# Patient Record
Sex: Female | Born: 1985 | Race: Black or African American | Hispanic: No | Marital: Single | Smoking: Never smoker
Health system: Southern US, Community
[De-identification: ages and names within clinical notes are randomized; demographics above are authoritative.]

## PROBLEM LIST (undated history)

## (undated) DIAGNOSIS — J45909 Unspecified asthma, uncomplicated: Secondary | ICD-10-CM

---

## 2011-03-24 ENCOUNTER — Emergency Department (HOSPITAL_COMMUNITY)
Admission: EM | Admit: 2011-03-24 | Discharge: 2011-03-24 | Disposition: A | Discharge status: Home / Self Care | Payer: Self-pay | Attending: Emergency Medicine | Admitting: Emergency Medicine

## 2011-03-24 ENCOUNTER — Encounter (HOSPITAL_COMMUNITY): Payer: Self-pay | Admitting: *Deleted

## 2011-03-24 DIAGNOSIS — H5710 Ocular pain, unspecified eye: Secondary | ICD-10-CM | POA: Insufficient documentation

## 2011-03-24 DIAGNOSIS — H109 Unspecified conjunctivitis: Secondary | ICD-10-CM | POA: Insufficient documentation

## 2011-03-24 MED ORDER — FLUORESCEIN SODIUM 1 MG OP STRP
1.0000 | ORAL_STRIP | Freq: Once | OPHTHALMIC | Status: AC
  Administered 2011-03-24: 1 via OPHTHALMIC

## 2011-03-24 MED ORDER — POLYMYXIN B-TRIMETHOPRIM 10000-0.1 UNIT/ML-% OP SOLN: 1.0000 [drp] | OPHTHALMIC | Status: AC

## 2011-03-24 MED ORDER — TETRACAINE HCL 0.5 % OP SOLN
2.0000 [drp] | Freq: Once | OPHTHALMIC | Status: AC
  Administered 2011-03-24: 2 [drp] via OPHTHALMIC
  Filled 2011-03-24: qty 2

## 2011-03-24 MED ORDER — FLUORESCEIN SODIUM 1 MG OP STRP
ORAL_STRIP | OPHTHALMIC | Status: AC
  Filled 2011-03-24: qty 1

## 2011-03-24 MED ORDER — FLUORESCEIN SODIUM 1 MG OP STRP
1.0000 | ORAL_STRIP | Freq: Once | OPHTHALMIC | Status: AC
  Administered 2011-03-24: 1 via OPHTHALMIC
  Filled 2011-03-24: qty 1

## 2011-03-24 MED ORDER — IBUPROFEN 600 MG PO TABS: 600.0000 mg | ORAL_TABLET | Freq: Four times a day (QID) | ORAL | Status: AC | PRN

## 2011-03-24 NOTE — ED Notes (Signed)
Reports irritation to left eye since yesterday morning, redness and swelling noted, reports intermittent vision changes and pain to eye, sensitive to light.

## 2011-03-24 NOTE — ED Notes (Signed)
Pt presents to department for evaluation of L eye redness and irritation. Onset Friday morning. Pt also states eye watering and blurred vision. Eye noted to be red and irritated upon arrival to ED. No discharge noted. No headaches. Pupils 3mm, round and reactive. Pt alert and oriented x4. No signs of distress at the present.

## 2011-03-24 NOTE — ED Provider Notes (Signed)
History     CSN: [id number]  Arrival date & time 03/24/11  1625   First MD Initiated Contact with Patient 03/24/11 1646      Chief Complaint  Patient presents with  . Eye Pain    (Consider location/radiation/quality/duration/timing/severity/associated sxs/prior treatment) Patient is a 26 y.o. female presenting with eye pain. The history is provided by the patient.  Eye Pain This is a new problem. The current episode started yesterday. The problem occurs constantly. The problem has been unchanged. Pertinent negatives include no abdominal pain, chills, congestion, coughing, fever, headaches, nausea, swollen glands or vomiting. Exacerbated by: light. She has tried nothing for the symptoms.    History reviewed. No pertinent past medical history.  History reviewed. No pertinent past surgical history.  History reviewed. No pertinent family history.  History  Substance Use Topics  . Smoking status: Not on file  . Smokeless tobacco: Not on file  . Alcohol Use: No    OB History    Grav Para Term Preterm Abortions TAB SAB Ect Mult Living                  Review of Systems  Constitutional: Negative for fever and chills.  HENT: Negative for congestion.   Eyes: Positive for pain.  Respiratory: Negative for cough.   Gastrointestinal: Negative for nausea, vomiting and abdominal pain.  Neurological: Negative for headaches.  All other systems reviewed and are negative.    Allergies  Review of patient's allergies indicates no known allergies.  Home Medications  No current outpatient prescriptions on file.  BP 100/59  Pulse 79  Temp(Src) 98.7 F (37.1 C) (Oral)  Resp 20  SpO2 99%  Physical Exam  Nursing note and vitals reviewed. Constitutional: She is oriented to person, place, and time. She appears well-developed and well-nourished. No distress.  HENT:  Head: Normocephalic and atraumatic.  Mouth/Throat: Oropharynx is clear and moist.  Eyes: EOM and lids are normal.  Pupils are equal, round, and reactive to light. No foreign bodies found. Left eye exhibits no hordeolum. No foreign body present in the left eye. Left conjunctiva is injected.  Slit lamp exam:      The left eye shows no corneal abrasion, no corneal ulcer, no foreign body and no fluorescein uptake.       Left eye injected with erythema and tearing. 20/25 visual acuity in right eye, 20/30 in left eye. Left eye IOP 17.   Neck: Normal range of motion. Neck supple.  Cardiovascular: Normal rate, regular rhythm and normal heart sounds.  Exam reveals no friction rub.   No murmur heard. Pulmonary/Chest: Effort normal and breath sounds normal. No respiratory distress. She has no wheezes. She has no rales.  Abdominal: Soft. There is no tenderness. There is no rebound and no guarding.  Musculoskeletal: Normal range of motion. She exhibits no edema and no tenderness.  Lymphadenopathy:    She has no cervical adenopathy.  Neurological: She is alert and oriented to person, place, and time.  Skin: Skin is warm and dry. No rash noted.  Psychiatric: She has a normal mood and affect. Her behavior is normal.    ED Course  Procedures (including critical care time)      1. Conjunctivitis       MDM  69:51 PM 26 year old female presenting with left eye pain, redness, irritation and tearing that started yesterday upon awakening. She endorses a foreign body sensation. She endorses intermittent blurry vision but denies any headache, nausea or vomiting. She  denies any history of similar symptoms in the past. She denies any history of recent trauma. Her conjunctivae appear inflamed and injected. There is no visible foreign body. Inversion of her eyelid was attempted but the view was limited due to the patient's involuntary spasm of her eyelid. Her visual acuity was intact. There is no pain with indirect consensual response. Patchy uptake was seen in the lower part of the sclera with fluorescein uptake. Intraocular  pressure 17. History and physical are not consistent with glaucoma. Her pain was improved with tetracaine, and given this along with the absence of worsening pain with indirect consensual response doubt uveitis. This is likely a conjunctivitis. Will discharge with Motrin, Polytrim drops and ophthalmology followup. This was discussed with the patient and she is understanding. The patient was given strong return precautions and discharged home in stable condition.        Sheran Luz, MD 03/24/11 2135  Sheran Luz, MD 03/24/11 2136

## 2011-03-25 NOTE — ED Provider Notes (Signed)
I saw and evaluated the patient, reviewed the resident's note and I agree with the findings and plan.   Michaela Shankel M Sheretta Grumbine, MD 03/25/11 0040 

## 2012-03-17 ENCOUNTER — Encounter (HOSPITAL_COMMUNITY): Payer: Self-pay | Admitting: Emergency Medicine

## 2012-03-17 ENCOUNTER — Emergency Department (HOSPITAL_COMMUNITY)
Admission: EM | Admit: 2012-03-17 | Discharge: 2012-03-17 | Disposition: A | Discharge status: Home / Self Care | Payer: Self-pay | Attending: Emergency Medicine | Admitting: Emergency Medicine

## 2012-03-17 DIAGNOSIS — M25519 Pain in unspecified shoulder: Secondary | ICD-10-CM | POA: Insufficient documentation

## 2012-03-17 DIAGNOSIS — R071 Chest pain on breathing: Secondary | ICD-10-CM | POA: Insufficient documentation

## 2012-03-17 DIAGNOSIS — R0789 Other chest pain: Secondary | ICD-10-CM

## 2012-03-17 MED ORDER — IBUPROFEN 100 MG/5ML PO SUSP: 600.0000 mg | Freq: Three times a day (TID) | ORAL | Status: DC | PRN

## 2012-03-17 MED ORDER — CYCLOBENZAPRINE HCL 10 MG PO TABS: 10.0000 mg | ORAL_TABLET | Freq: Two times a day (BID) | ORAL | Status: DC | PRN

## 2012-03-17 NOTE — ED Provider Notes (Signed)
Medical screening examination/treatment/procedure(s) were performed by non-physician practitioner and as supervising physician I was immediately available for consultation/collaboration.   Richardean Canal, MD 03/17/12 [phone]

## 2012-03-17 NOTE — ED Notes (Signed)
Rt sided  Shoulder and back pain x 4-5 days  No injury she states hurts to cough

## 2012-03-17 NOTE — ED Notes (Signed)
Patient is alert and orientedx4.  Patient was explained discharge instructions and they understood them with no questions.   

## 2012-03-17 NOTE — ED Provider Notes (Signed)
History     CSN: [id number]  Arrival date & time 03/17/12  1151   First MD Initiated Contact with Patient 03/17/12 1345      No chief complaint on file.   (Consider location/radiation/quality/duration/timing/severity/associated sxs/prior treatment) [name] presenting with shoulder pain. The history is provided by the patient.  Shoulder Pain This is a new problem. Associated symptoms include chest pain. Pertinent negatives include no abdominal pain, chills, coughing, fever, nausea, rash or vomiting. Associated symptoms comments: 4-5 days of pain and soreness in right shoulder and right chest wall without known injury. She reports she has a 45-year old she is constantly picking up but other than that she does not do any heavy lifting. No SOB, cough, fever, nausea, abdominal pain. The discomfort is felt when moving. There is no increased pain with breathing. No back pain.Marland Kitchen    History reviewed. No pertinent past medical history.  History reviewed. No pertinent past surgical history.  No family history on file.  History  Substance Use Topics  . Smoking status: Never Smoker   . Smokeless tobacco: Not on file  . Alcohol Use: Yes    OB History   Grav Para Term Preterm Abortions TAB SAB Ect Mult Living                  Review of Systems  Constitutional: Negative for fever and chills.  Respiratory: Negative.  Negative for cough and shortness of breath.   Cardiovascular: Positive for chest pain.       See HPI.  Gastrointestinal: Negative.  Negative for nausea, vomiting and abdominal pain.  Musculoskeletal:       C/O chest pain.  Skin: Negative.  Negative for rash.    Allergies  Review of patient's allergies indicates no known allergies.  Home Medications  No current outpatient prescriptions on file.  BP 99/62  Pulse 65  Temp(Src) 98.9 F (37.2 C)  Resp 18  SpO2 100%  Physical Exam  Constitutional: She is oriented to person, place, and time. She  appears well-developed and well-nourished. No distress.  Neck: Normal range of motion.  Cardiovascular: Normal rate and regular rhythm.   No murmur heard. Pulmonary/Chest: Effort normal. She has no wheezes. She has no rales.  Mild tenderness to right chest wall laterally. No bruising or swelling. No breast tenderness. Right shoulder without swelling or tenderness.   Abdominal: Soft. She exhibits no mass. There is no tenderness. There is no rebound and no guarding.  No tenderness to light or deep palpation of abdomen. No increased chest pain with palpation of abdomen.  Neurological: She is alert and oriented to person, place, and time.  Skin: Skin is warm and dry.  Psychiatric: She has a normal mood and affect.    ED Course  Procedures (including critical care time)  Labs Reviewed - No data to display No results found.   No diagnosis found.  1. Chest wall pain   MDM  Musculoskeletal chest wall pain. Without fever, SOB, cough or abdominal pain, doubt other acute process.        Arnoldo Hooker, PA-C 03/17/12 1512

## 2012-03-17 NOTE — Discharge Instructions (Signed)
Chest Wall Pain Chest wall pain is pain in or around the bones and muscles of your chest. It may take up to 6 weeks to get better. It may take longer if you must stay physically active in your work and activities.  CAUSES  Chest wall pain may happen on its own. However, it may be caused by:  A viral illness like the flu.  Injury.  Coughing.  Exercise.  Arthritis.  Fibromyalgia.  Shingles. HOME CARE INSTRUCTIONS   Avoid overtiring physical activity. Try not to strain or perform activities that cause pain. This includes any activities using your chest or your abdominal and side muscles, especially if heavy weights are used.  Put ice on the sore area.  Put ice in a plastic bag.  Place a towel between your skin and the bag.  Leave the ice on for 15 to 20 minutes per hour while awake for the first 2 days.  Only take over-the-counter or prescription medicines for pain, discomfort, or fever as directed by your caregiver. SEEK IMMEDIATE MEDICAL CARE IF:   Your pain increases, or you are very uncomfortable.  You have a fever.  Your chest pain becomes worse.  You have new, unexplained symptoms.  You have nausea or vomiting.  You feel sweaty or lightheaded.  You have a cough with phlegm (sputum), or you cough up blood. MAKE SURE YOU:   Understand these instructions.  Will watch your condition.  Will get help right away if you are not doing well or get worse. Document Released: 01/15/2005 Document Revised: 04/09/2011 Document Reviewed: 09/11/2010 [facility] Patient Information 2013 [place], [place]. Heat Therapy Heat therapy can help ease achy, tense, stiff, and tight muscles and joints. Heat should not be used on new injuries. Wait at least 48 hours after the injury before using heat therapy. Heat also should not be used for discomfort or pain that occurs right after doing an activity. If you still have pain or stiffness 3 hours after finishing the activity, then heat  therapy may be used. PRECAUTIONS  High heat or prolonged exposure to heat can cause burns. Be careful when using heat therapy to avoid burning your skin. If you have any of the following conditions, do not use heat until you have discussed heat therapy with your caregiver:  Poor circulation.  Healing wounds or scarred skin in the area being treated.  Diabetes, heart disease, or high blood pressure.  Numbness of the area being treated.  Unusual swelling of the area being treated.  Active infections.  Blood clots.  Cancer.  Inability to communicate your response to pain. This can include young children and people with dementia. HOME CARE INSTRUCTIONS Moist heat pack  Soak a clean towel in warm water, and squeeze out the extra water. The water temperature should be comfortable to the skin.  Put the warm, wet towel in a plastic bag.  Place a thin, dry towel between your skin and the bag.  Put the heat pack on the area for 5 minutes, and check your skin. Your skin may be pink, but it should not be red.  Leave the heat pack on the area for a total of 15 to 30 minutes.  Repeat this every 2 to 4 hours while awake. Do not use heat while you are sleeping. Warm water bath  Fill a tub with warm water. The water temperature should be comfortable to the skin.  Place the affected body part in the tub.  Soak the area for 20  to 40 minutes.  Repeat as needed. Hot water bottle  Fill the water bottle half full with hot water.  Press out the extra air. Close the cap tightly.  Place a dry towel between your skin and the bottle.  Put the bottle on the area for 5 minutes, and check your skin. Your skin may be pink, but it should not be red.  Leave the bottle on the area for a total of 15 to 30 minutes.  Repeat this every 2 to 4 hours while awake. Electric heating pad  Place a dry towel between your skin and the heating pad.  Set the heating pad on low heat.  Put the heating pad  on the area for 10 minutes, and check your skin. Your skin may be pink, but it should not be red.  Leave the heating pad on the area for a total of 20 to 40 minutes.  Repeat this every 2 to 4 hours while awake.  Do not lie on the heating pad.  Do not fall asleep while using the heating pad.  Do not use the heating pad near water. Contact with water can result in an electrical shock. SEEK MEDICAL CARE IF:  You have blisters, redness, swelling, or numbness.  You have any new problems.  Your problems are getting worse.  You have any questions or concerns. If you develop any problems, stop using heat therapy until you see your caregiver. MAKE SURE YOU:  Understand these instructions.  Will watch your condition.  Will get help right away if you are not doing well or get worse. Document Released: 04/09/2011 Document Reviewed: 04/09/2011 [facility] Patient Information 2013 [place], [place].

## 2012-06-10 ENCOUNTER — Encounter (HOSPITAL_COMMUNITY): Payer: Self-pay | Admitting: Emergency Medicine

## 2012-06-10 ENCOUNTER — Emergency Department (HOSPITAL_COMMUNITY)
Admission: EM | Admit: 2012-06-10 | Discharge: 2012-06-10 | Disposition: A | Discharge status: Home / Self Care | Payer: Self-pay | Attending: Emergency Medicine | Admitting: Emergency Medicine

## 2012-06-10 DIAGNOSIS — J029 Acute pharyngitis, unspecified: Secondary | ICD-10-CM | POA: Insufficient documentation

## 2012-06-10 DIAGNOSIS — R05 Cough: Secondary | ICD-10-CM | POA: Insufficient documentation

## 2012-06-10 DIAGNOSIS — R059 Cough, unspecified: Secondary | ICD-10-CM | POA: Insufficient documentation

## 2012-06-10 DIAGNOSIS — R062 Wheezing: Secondary | ICD-10-CM | POA: Insufficient documentation

## 2012-06-10 MED ORDER — BENZONATATE 100 MG PO CAPS: 100.0000 mg | ORAL_CAPSULE | Freq: Three times a day (TID) | ORAL | Status: DC

## 2012-06-10 MED ORDER — AEROCHAMBER PLUS FLO-VU MEDIUM MISC: 1.0000 | Freq: Once | Status: DC

## 2012-06-10 MED ORDER — AEROCHAMBER Z-STAT PLUS/MEDIUM MISC
1.0000 | Freq: Once | Status: AC
  Administered 2012-06-10: 1

## 2012-06-10 MED ORDER — ALBUTEROL SULFATE HFA 108 (90 BASE) MCG/ACT IN AERS
2.0000 | INHALATION_SPRAY | RESPIRATORY_TRACT | Status: DC | PRN
  Administered 2012-06-10: 2 via RESPIRATORY_TRACT
  Filled 2012-06-10: qty 6.7

## 2012-06-10 NOTE — ED Notes (Signed)
Cough x 3 weeks getting worse has sorethroat now

## 2012-06-10 NOTE — ED Provider Notes (Signed)
Medical screening examination/treatment/procedure(s) were performed by non-physician practitioner and as supervising physician I was immediately available for consultation/collaboration.  Demontez Novack L Domenik Trice, MD 06/10/12 1628 

## 2012-06-10 NOTE — ED Provider Notes (Signed)
History     CSN: [id number]  Arrival date & time 06/10/12  1230   First MD Initiated Contact with Patient 06/10/12 1249      Chief Complaint  Patient presents with  . Cough    (Consider location/radiation/quality/duration/timing/severity/associated sxs/prior treatment) HPI Comments: Patient presents with nonproductive cough for the past 3 weeks. At the onset she had viral upper respiratory tract infection symptoms. Symptoms resolved except for cough. No treatments prior to arrival. No shortness of breath. Patient is not a smoker. Onset of symptoms gradual. Course is persistent. Nothing makes symptoms better or worse. Patient noted a mild sore throat today.   [name] presenting with cough. The history is provided by the patient.  Cough Associated symptoms: sore throat   Associated symptoms: no chest pain, no fever, no headaches, no myalgias, no rash, no rhinorrhea and no shortness of breath     History reviewed. No pertinent past medical history.  No past surgical history on file.  No family history on file.  History  Substance Use Topics  . Smoking status: Never Smoker   . Smokeless tobacco: Not on file  . Alcohol Use: Yes    OB History   Grav Para Term Preterm Abortions TAB SAB Ect Mult Living                  Review of Systems  Constitutional: Negative for fever.  HENT: Positive for sore throat. Negative for rhinorrhea.   Eyes: Negative for redness.  Respiratory: Positive for cough. Negative for shortness of breath.   Cardiovascular: Negative for chest pain.  Gastrointestinal: Negative for nausea, vomiting, abdominal pain and diarrhea.  Genitourinary: Negative for dysuria.  Musculoskeletal: Negative for myalgias.  Skin: Negative for rash.  Neurological: Negative for headaches.    Allergies  Review of patient's allergies indicates no known allergies.  Home Medications   Current Outpatient Rx  Name  Route  Sig  Dispense  Refill  .  benzonatate (TESSALON) 100 MG capsule   Oral   Take 1 capsule (100 mg total) by mouth every 8 (eight) hours.   15 capsule   0     BP 104/69  Pulse 81  Temp(Src) 99.4 F (37.4 C)  Resp 16  SpO2 99%  Physical Exam  Nursing note and vitals reviewed. Constitutional: She appears well-developed and well-nourished.  HENT:  Head: Normocephalic and atraumatic. No trismus in the jaw.  Right Ear: Tympanic membrane, external ear and ear canal normal.  Left Ear: Tympanic membrane, external ear and ear canal normal.  Nose: Nose normal. No mucosal edema or rhinorrhea.  Mouth/Throat: Uvula is midline, oropharynx is clear and moist and mucous membranes are normal. Mucous membranes are not dry. No oral lesions. No edematous. No oropharyngeal exudate, posterior oropharyngeal edema, posterior oropharyngeal erythema or tonsillar abscesses.  Eyes: Conjunctivae are normal. Right eye exhibits no discharge. Left eye exhibits no discharge.  Neck: Normal range of motion. Neck supple.  Cardiovascular: Normal rate, regular rhythm and normal heart sounds.   No murmur heard. Pulmonary/Chest: Effort normal. No respiratory distress. She has wheezes (Mild, expiratory, bases). She has no rales.  Abdominal: Soft. There is no tenderness.  Lymphadenopathy:    She has no cervical adenopathy.  Neurological: She is alert.  Skin: Skin is warm and dry.  Psychiatric: She has a normal mood and affect.    ED Course  Procedures (including critical care time)  Labs Reviewed - No data to display No results found.  1. Post-viral cough syndrome    1:17 PM Patient seen and examined.   Vital signs reviewed and are as follows: Filed Vitals:   06/10/12 1239  BP: 104/69  Pulse: 81  Temp: 99.4 F (37.4 C)  Resp: 16   Patient counseled on use of albuterol HFA.  Told to use 1-2 puffs q 4 hours as needed for SOB.  Patient urged to return with worsening symptoms or other concerns. Patient verbalized understanding and  agrees with plan.    MDM  Patient with continued cough after a viral syndrome. Will treat conservatively with albuterol inhaler and Tessalon. Patient appears well. No respiratory distress.        Renne Crigler, PA-C 06/10/12 1321

## 2012-09-21 ENCOUNTER — Emergency Department (HOSPITAL_COMMUNITY)
Admission: EM | Admit: 2012-09-21 | Discharge: 2012-09-21 | Disposition: A | Discharge status: Home / Self Care | Payer: Self-pay | Attending: Emergency Medicine | Admitting: Emergency Medicine

## 2012-09-21 ENCOUNTER — Emergency Department (HOSPITAL_COMMUNITY): Payer: Self-pay

## 2012-09-21 DIAGNOSIS — S93402A Sprain of unspecified ligament of left ankle, initial encounter: Secondary | ICD-10-CM

## 2012-09-21 DIAGNOSIS — Y939 Activity, unspecified: Secondary | ICD-10-CM | POA: Insufficient documentation

## 2012-09-21 DIAGNOSIS — Y929 Unspecified place or not applicable: Secondary | ICD-10-CM | POA: Insufficient documentation

## 2012-09-21 DIAGNOSIS — W010XXA Fall on same level from slipping, tripping and stumbling without subsequent striking against object, initial encounter: Secondary | ICD-10-CM | POA: Insufficient documentation

## 2012-09-21 DIAGNOSIS — S93409A Sprain of unspecified ligament of unspecified ankle, initial encounter: Secondary | ICD-10-CM | POA: Insufficient documentation

## 2012-09-21 MED ORDER — IBUPROFEN 100 MG/5ML PO SUSP
600.0000 mg | Freq: Once | ORAL | Status: AC
  Administered 2012-09-21: 600 mg via ORAL
  Filled 2012-09-21: qty 30

## 2012-09-21 NOTE — ED Notes (Signed)
Pt states that she fell and hurt left ankle around 1900

## 2012-09-21 NOTE — ED Provider Notes (Signed)
CSN: [id number]     Arrival date & time 09/21/12  0117 History     None    Chief Complaint  Patient presents with  . Ankle Pain   (Consider location/radiation/quality/duration/timing/severity/associated sxs/prior Treatment) HPI Comments: Patient states she was "fallen around" slipping and injuring her left ankle about 7:00 tonight.  She tried putting ice on it for short period of time, which did not help.  She has a moderate amount of swelling to the lateral malleolus, and painful ambulation.  She did not take the medication or anti-inflammatory for pain control.  She has no history of prior injury to this ankle  [name] presenting with ankle pain. The history is provided by the patient.  Ankle Pain Location:  Ankle Time since incident:  6 hours Injury: yes   Ankle location:  L ankle Pain details:    Quality:  Aching   Radiates to:  Does not radiate   Severity:  Mild   Onset quality:  Sudden   Timing:  Constant Chronicity:  New Dislocation: no   Foreign body present:  No foreign bodies Prior injury to area:  No Relieved by:  None tried Worsened by:  Activity Ineffective treatments:  Ice Associated symptoms: swelling     No past medical history on file. No past surgical history on file. No family history on file. History  Substance Use Topics  . Smoking status: Never Smoker   . Smokeless tobacco: Not on file  . Alcohol Use: Yes   OB History   Grav Para Term Preterm Abortions TAB SAB Ect Mult Living                 Review of Systems  Constitutional: Positive for activity change.  Musculoskeletal: Positive for joint swelling.    Allergies  Review of patient's allergies indicates no known allergies.  Home Medications  No current outpatient prescriptions on file. BP 108/58  Pulse 68  Temp(Src) 98.3 F (36.8 C) (Oral)  Resp 18  SpO2 100% Physical Exam  Nursing note and vitals reviewed. Constitutional: She appears well-developed and  well-nourished.  HENT:  Head: Normocephalic.  Eyes: Pupils are equal, round, and reactive to light.  Neck: Normal range of motion.  Cardiovascular: Normal rate and regular rhythm.   Musculoskeletal: She exhibits edema and tenderness.       Feet:  Neurological: She is alert.    ED Course   Procedures (including critical care time)  Labs Reviewed - No data to display Dg Ankle Complete Left  09/21/2012   *RADIOLOGY REPORT*  Clinical Data: Trauma.  Fall.  Patient twisted left ankle.  Pain and swelling.  LEFT ANKLE COMPLETE - 3+ VIEW  Comparison: None.  Findings: There is lateral greater than medial swelling about the left ankle.  The ankle mortis and talar dome appear intact.  No evidence of acute fracture or subluxation.  No focal bone lesion or bone destruction.  Bone cortex and trabecular architecture appear intact.  No radiopaque soft tissue foreign bodies.  IMPRESSION: Soft tissue swelling about the left ankle.  No acute bony abnormalities are demonstrated.   Original Report Authenticated By: Burman Nieves, M.D.   1. Left ankle sprain, initial encounter     MDM     Arman Filter, NP 09/21/12 2134  Medical screening examination/treatment/procedure(s) were conducted as a shared visit with non-physician practitioner(s) or resident and myself. I personally evaluated the patient during the encounter and agree with the findings and plan  unless otherwise indicated.  Slipped and twisted ankle 7 pm tonight. Worse with movement. Xray reviewed, no acute fx.  Motrin in ED. Fup discussed. Pt does not want crutches. DC Ankle sprain  Enid Skeens, MD 09/22/12 [phone]

## 2012-09-21 NOTE — ED Notes (Signed)
Patient transported to X-ray 

## 2012-12-31 ENCOUNTER — Emergency Department (HOSPITAL_COMMUNITY)
Admission: EM | Admit: 2012-12-31 | Discharge: 2012-12-31 | Disposition: A | Discharge status: Home / Self Care | Payer: Self-pay | Attending: Emergency Medicine | Admitting: Emergency Medicine

## 2012-12-31 ENCOUNTER — Encounter (HOSPITAL_COMMUNITY): Payer: Self-pay | Admitting: Emergency Medicine

## 2012-12-31 DIAGNOSIS — R1110 Vomiting, unspecified: Secondary | ICD-10-CM

## 2012-12-31 DIAGNOSIS — J069 Acute upper respiratory infection, unspecified: Secondary | ICD-10-CM

## 2012-12-31 DIAGNOSIS — J45909 Unspecified asthma, uncomplicated: Secondary | ICD-10-CM | POA: Insufficient documentation

## 2012-12-31 HISTORY — DX: Unspecified asthma, uncomplicated: J45.909

## 2012-12-31 MED ORDER — ALBUTEROL SULFATE HFA 108 (90 BASE) MCG/ACT IN AERS
2.0000 | INHALATION_SPRAY | Freq: Once | RESPIRATORY_TRACT | Status: AC
  Administered 2012-12-31: 2 via RESPIRATORY_TRACT
  Filled 2012-12-31: qty 6.7

## 2012-12-31 NOTE — ED Provider Notes (Signed)
CSN: [id number]     Arrival date & time 12/31/12  1006 History   First MD Initiated Contact with Patient 12/31/12 1117     Chief Complaint  Patient presents with  . Nasal Congestion  . Emesis   (Consider location/radiation/quality/duration/timing/severity/associated sxs/prior Treatment) [name] presenting with vomiting. The history is provided by the patient. No language interpreter was used.  Emesis Severity:  Mild Duration:  1 day Recent urination:  Normal Context: not post-tussive and not self-induced   Relieved by:  None tried Associated symptoms: URI   Associated symptoms: no abdominal pain, no arthralgias, no chills, no diarrhea, no fever, no headaches and no myalgias   Risk factors: no suspect food intake and no travel to endemic areas   Pt is a 27 year old female who presents with URI symptoms for the last 2-3 days and reports 2 episodes of vomiting since last night. She reports that she has had some nasal congestion and an irritated, non-productive cough for the last couple of days. She denies fever, chills, nausea, diarrhea, stiff neck or neck pain. She reports a mild sore throat but no difficulty breathing or shortness of breath. She awoke during the night and had 2 episode of vomiting and has not eaten anything today. She is voiding in her regular pattern without any dysuria or other urinary symptoms. She denies abdominal pain or chest pain. No recent known sick exposure or recent travel.    Past Medical History  Diagnosis Date  . Asthma    No past surgical history on file. No family history on file. History  Substance Use Topics  . Smoking status: Never Smoker   . Smokeless tobacco: Not on file  . Alcohol Use: Yes   OB History   Grav Para Term Preterm Abortions TAB SAB Ect Mult Living                 Review of Systems  Constitutional: Negative for chills and fatigue.  Respiratory: Positive for cough. Negative for chest tightness, shortness of  breath, wheezing and stridor.   Cardiovascular: Negative for chest pain.  Gastrointestinal: Positive for vomiting. Negative for nausea, abdominal pain and diarrhea.  Genitourinary: Negative for dysuria.  Musculoskeletal: Negative for arthralgias, back pain, myalgias, neck pain and neck stiffness.  Neurological: Negative for headaches.  All other systems reviewed and are negative.    Allergies  Review of patient's allergies indicates no known allergies.  Home Medications  No current outpatient prescriptions on file. BP 99/61  Pulse 72  Temp(Src) 98.3 F (36.8 C) (Oral)  Resp 18  Wt 158 lb (71.668 kg)  SpO2 100% Physical Exam  Nursing note and vitals reviewed. Constitutional: She is oriented to person, place, and time. She appears well-developed and well-nourished. No distress.  HENT:  Head: Normocephalic and atraumatic.  Mouth/Throat: Uvula is midline. Posterior oropharyngeal erythema present.  Tonsils grade II. No difficulty swallowing.  Eyes: Conjunctivae and EOM are normal. Pupils are equal, round, and reactive to light.  Neck: Normal range of motion. Neck supple. No JVD present. No tracheal deviation present. No thyromegaly present.  Cardiovascular: Normal rate, regular rhythm, normal heart sounds and intact distal pulses.   Pulmonary/Chest: Effort normal and breath sounds normal. No stridor.  Abdominal: Soft. Bowel sounds are normal. She exhibits no distension. There is no tenderness.  Musculoskeletal: Normal range of motion.  Lymphadenopathy:    She has no cervical adenopathy.  Neurological: She is alert and oriented to person, place,  and time.  Skin: Skin is warm and dry.  Psychiatric: She has a normal mood and affect. Her behavior is normal. Judgment and thought content normal.    ED Course  Procedures (including critical care time) Labs Review Labs Reviewed - No data to display Imaging Review No results found.  EKG Interpretation   None       MDM   1.  URI (upper respiratory infection)   2. Emesis    Afebrile. Non-toxic appearing. URI symptoms for 2 days and a couple episodes of vomiting. In no acute distress. No shortness of breath or difficulty breathing, no chest pain. No abdominal pain or nausea. Tolerating PO fluids here without difficulty. Home with instructions for management of URI symptoms; humidifier, corticosteroid nasal spray and prescription for albuterol inhaler to use as needed if chest tightness or irritation at night. Discussed plan with pt and agrees to return if symptoms worsen. Pt is stable for discharge.      Irish Elders, NP 12/31/12 1249

## 2012-12-31 NOTE — ED Provider Notes (Signed)
Medical screening examination/treatment/procedure(s) were performed by non-physician practitioner and as supervising physician I was immediately available for consultation/collaboration.     Geoffery Lyons, MD 12/31/12 [phone]

## 2012-12-31 NOTE — ED Notes (Signed)
Congestion and vomiting since yesterday

## 2013-03-16 ENCOUNTER — Emergency Department (HOSPITAL_COMMUNITY)
Admission: EM | Admit: 2013-03-16 | Discharge: 2013-03-16 | Disposition: A | Discharge status: Home / Self Care | Payer: Self-pay | Attending: Emergency Medicine | Admitting: Emergency Medicine

## 2013-03-16 ENCOUNTER — Encounter (HOSPITAL_COMMUNITY): Payer: Self-pay | Admitting: Emergency Medicine

## 2013-03-16 DIAGNOSIS — R109 Unspecified abdominal pain: Secondary | ICD-10-CM | POA: Insufficient documentation

## 2013-03-16 DIAGNOSIS — Z79899 Other long term (current) drug therapy: Secondary | ICD-10-CM | POA: Insufficient documentation

## 2013-03-16 DIAGNOSIS — R42 Dizziness and giddiness: Secondary | ICD-10-CM | POA: Insufficient documentation

## 2013-03-16 DIAGNOSIS — Z3202 Encounter for pregnancy test, result negative: Secondary | ICD-10-CM | POA: Insufficient documentation

## 2013-03-16 DIAGNOSIS — J45909 Unspecified asthma, uncomplicated: Secondary | ICD-10-CM | POA: Insufficient documentation

## 2013-03-16 LAB — URINALYSIS, ROUTINE W REFLEX MICROSCOPIC
Bilirubin Urine: NEGATIVE
Glucose, UA: NEGATIVE mg/dL
Hgb urine dipstick: NEGATIVE
Ketones, ur: NEGATIVE mg/dL
Nitrite: NEGATIVE
Protein, ur: NEGATIVE mg/dL
Specific Gravity, Urine: 1.031 — ABNORMAL HIGH (ref 1.005–1.030)
Urobilinogen, UA: 1 mg/dL (ref 0.0–1.0)
pH: 5.5 (ref 5.0–8.0)

## 2013-03-16 LAB — COMPREHENSIVE METABOLIC PANEL
Alkaline Phosphatase: 56 U/L (ref 39–117)
BUN: 10 mg/dL (ref 6–23)
GFR calc Af Amer: >90 mL/min (ref >90)
GFR calc non Af Amer: >90 mL/min (ref >90)
Glucose, Bld: 101 mg/dL — ABNORMAL HIGH (ref 70–99)
Potassium: 4.4 mEq/L (ref 3.7–5.3)
Total Protein: 8.2 g/dL (ref 6.0–8.3)

## 2013-03-16 LAB — CBC WITH DIFFERENTIAL/PLATELET
Basophils Absolute: 0 K/uL (ref 0.0–0.1)
Basophils Relative: 0 % (ref 0–1)
Eosinophils Absolute: 0.1 10*3/uL (ref 0.0–0.7)
Eosinophils Relative: 1 % (ref 0–5)
HCT: 42.9 % (ref 36.0–46.0)
Hemoglobin: 14.4 g/dL (ref 12.0–15.0)
Lymphocytes Relative: 9 % — ABNORMAL LOW (ref 12–46)
Lymphs Abs: 0.6 10*3/uL — ABNORMAL LOW (ref 0.7–4.0)
MCH: 30.7 pg (ref 26.0–34.0)
MCHC: 33.6 g/dL (ref 30.0–36.0)
MCV: 91.5 fL (ref 78.0–100.0)
Monocytes Absolute: 0.7 K/uL (ref 0.1–1.0)
Monocytes Relative: 10 % (ref 3–12)
Neutro Abs: 5.6 K/uL (ref 1.7–7.7)
Neutrophils Relative %: 80 % — ABNORMAL HIGH (ref 43–77)
Platelets: 193 K/uL (ref 150–400)
RBC: 4.69 MIL/uL (ref 3.87–5.11)
RDW: 12.8 % (ref 11.5–15.5)
WBC: 7 K/uL (ref 4.0–10.5)

## 2013-03-16 LAB — COMPREHENSIVE METABOLIC PANEL WITH GFR
ALT: 11 U/L (ref 0–35)
AST: 17 U/L (ref 0–37)
Albumin: 4.2 g/dL (ref 3.5–5.2)
CO2: 25 meq/L (ref 19–32)
Calcium: 9.4 mg/dL (ref 8.4–10.5)
Chloride: 102 meq/L (ref 96–112)
Creatinine, Ser: 0.79 mg/dL (ref 0.50–1.10)
Sodium: 139 meq/L (ref 137–147)
Total Bilirubin: 0.9 mg/dL (ref 0.3–1.2)

## 2013-03-16 LAB — URINE MICROSCOPIC-ADD ON

## 2013-03-16 LAB — POCT PREGNANCY, URINE: Preg Test, Ur: NEGATIVE

## 2013-03-16 NOTE — ED Notes (Signed)
Pt reports lower abdominal pain that started last night, headache, weakness, and some bump on her hands.  LMP - January.  No vaginal discharge or bleeding, no urinary symptoms.  No constipation or diarrhea

## 2013-03-16 NOTE — Discharge Instructions (Signed)
Read the information below.  You may return to the Emergency Department at any time for worsening condition or any new symptoms that concern you.  If you develop high fevers, worsening abdominal pain, uncontrolled vomiting, or are unable to tolerate fluids by mouth, return to the ER for a recheck.     Abdominal Pain, Adult Many things can cause abdominal pain. Usually, abdominal pain is not caused by a disease and will improve without treatment. It can often be observed and treated at home. Your health care provider will do a physical exam and possibly order blood tests and X-rays to help determine the seriousness of your pain. However, in many cases, more time must pass before a clear cause of the pain can be found. Before that point, your health care provider may not know if you need more testing or further treatment. HOME CARE INSTRUCTIONS  Monitor your abdominal pain for any changes. The following actions may help to alleviate any discomfort you are experiencing:  Only take over-the-counter or prescription medicines as directed by your health care provider.  Do not take laxatives unless directed to do so by your health care provider.  Try a clear liquid diet (broth, tea, or water) as directed by your health care provider. Slowly move to a bland diet as tolerated. SEEK MEDICAL CARE IF:  You have unexplained abdominal pain.  You have abdominal pain associated with nausea or diarrhea.  You have pain when you urinate or have a bowel movement.  You experience abdominal pain that wakes you in the night.  You have abdominal pain that is worsened or improved by eating food.  You have abdominal pain that is worsened with eating fatty foods. SEEK IMMEDIATE MEDICAL CARE IF:   Your pain does not go away within 2 hours.  You have a fever.  You keep throwing up (vomiting).  Your pain is felt only in portions of the abdomen, such as the right side or the left lower portion of the  abdomen.  You pass bloody or black tarry stools. MAKE SURE YOU:  Understand these instructions.   Will watch your condition.   Will get help right away if you are not doing well or get worse.  Document Released: 10/25/2004 Document Revised: 11/05/2012 Document Reviewed: 09/24/2012 [facility] Patient Information 2014 [employer] [place].   Emergency Department Resource Guide 1) Find a Doctor and Pay Out of Pocket Although you won't have to find out who is covered by your insurance plan, it is a good idea to ask around and get recommendations. You will then need to call the office and see if the doctor you have chosen will accept you as a new patient and what types of options they offer for patients who are self-pay. Some doctors offer discounts or will set up payment plans for their patients who do not have insurance, but you will need to ask so you aren't surprised when you get to your appointment.  2) Contact Your Local Health Department Not all health departments have doctors that can see patients for sick visits, but many do, so it is worth a call to see if yours does. If you don't know where your local health department is, you can check in your phone book. The CDC also has a tool to help you locate your state's health department, and many state websites also have listings of all of their local health departments.  3) Find a Walk-in Clinic If your illness is not likely to be very  severe or complicated, you may want to try a walk in clinic. These are popping up all over the country in pharmacies, drugstores, and shopping centers. They're usually staffed by nurse practitioners or physician assistants that have been trained to treat common illnesses and complaints. They're usually fairly quick and inexpensive. However, if you have serious medical issues or chronic medical problems, these are probably not your best option.  No Primary Care Doctor: - Call Health Connect at  [phone] - they can  help you locate a primary care doctor that  accepts your insurance, provides certain services, etc. - Physician Referral Service- [phone]  Chronic Pain Problems: Organization         Address  Phone   Notes  [facility]  [phone] Patients need to be referred by their primary care doctor.   Medication Assistance: Organization         Address  Phone   Notes  [facility] Medication [facility] [address] [place] [place], [place] [address] [phone] --Must be a resident of [facility] -- Must have NO insurance coverage whatsoever (no Medicaid/ Medicare, etc.) -- The pt. MUST have a primary care doctor that directs their care regularly and follows them in the community   [facility]  [phone]   [employer]  [phone]    Agencies that provide inexpensive medical care: Organization         Address  Phone   Notes  [facility]  [phone]   [facility]    [phone]   [facility] [address] [place], [place] [address] [phone]   [facility] [place] [address], [place] [phone]   Planned Parenthood    [phone]   [facility]    [phone]   [facility] and [facility]  [address], [place] Phone:  [phone], Fax:  [phone] Hours of Operation:  9 am - 6 pm, M-F.  Also accepts Medicaid/Medicare and self-pay.  [facility]  [address], [place] Phone: [phone], Fax: [phone]. Hours of Operation:  8:30 am - 5:30 pm, M-F.  Also accepts Medicaid and self-pay.  [facility] [place] [address] [place] Phone: [phone]   [facility] [address] Natasha Bence [place], [place] [phone], Ext. [phone] Mondays & Thursdays: 7-9 AM.  First 15 patients are seen on a first come, first serve basis.    Medicaid-accepting [facility] Providers:  Organization         Address  Phone   Notes  [facility] [address], [place] [phone] Also accepts self-pay patients.  [facility] [address] Laurell Josephs [place], [place]  [phone]   [facility] [address], [place] [phone]   [facility] [address], [place] [phone]   Renaye Rakers [address], [place]   [phone] Only accepts [place] Access [place] patients after they have their name applied to their card.   Self-Pay (no insurance) in [facility]:  Organization         Address  Phone   Notes  Sickle Cell Patients, [facility] Internal Medicine [address] [place], [place] [phone]   [facility] [phone]  [address] [place] [phone]   [facility] [place]  [address], [place] [phone]   [facility]/Dr. Osei-Bonsu  [address], [place] or [address], [place] [phone] Phone number for both [employer] and [place] locations is the same.  Urgent Medical and [facility] [address], [place] [phone]   [facility] [address] [place] or [address] [phone]   [facility] [address] [place] [phone], phone; [phone], fax Sees patients 1st and 3rd Saturday of every month.  Must not qualify for public or private insurance (i.e. Medicaid, Medicare, [place] Health Choice, Veterans' Benefits)  Household income should be no more than 200% of the poverty level The clinic cannot treat you if you are pregnant or think you are pregnant  Sexually transmitted diseases are not treated at the clinic.    Dental Care: Organization         Address  Phone  Notes  [facility] Department of [facility]  [facility] [address] [place], [place] [phone] Accepts children up to age 26 who are enrolled in [place] or [place] Health Choice; pregnant women with a Medicaid card; and children who have applied for Medicaid or [place] Health Choice, but were declined, whose parents can pay a reduced fee at time of service.  [facility] of [facility]  [address], [place] [phone] Accepts children up to age 82 who are enrolled in [place] or [place] Health Choice; pregnant women with a Medicaid card; and children who have applied for Medicaid or [place] Health Choice, but were declined, whose parents can pay a reduced fee at time of service.  [facility]  [address] [place], [place] [phone] Patients are seen by appointment only. Walk-ins are not accepted. [facility] will see patients 45 years of age and older. Monday - Tuesday (8am-5pm) Most Wednesdays (8:30-5pm) $30 per visit, cash only  [facility] Adult Dental Access PROGRAM  [address], [facility] [phone] Patients are seen by appointment only. Walk-ins are not accepted. [facility] will see patients 46 years of age and older. One Wednesday Evening (Monthly: Volunteer Based).  $30 per visit, cash only  [employer]  [phone] for adults; Children under age 79, call Graduate Pediatric Dentistry at [phone]. Children aged 84-14, please call [phone] to request a pediatric application.  Dental services are provided in all areas of dental care including fillings, crowns and bridges, complete and partial dentures, implants, gum treatment, root canals, and extractions. Preventive care is also provided. Treatment is provided to both adults and children. Patients are selected via a lottery and there is often a waiting list.   [facility] [address] [place]  [phone] [email or web address]   [facility] [address] [place], [place] [phone], Ext. [phone] Second and Fourth Thursday of each month, opens at 6:30 AM; Clinic ends at 9 AM.  Patients are seen on a first-come first-served basis, and a limited number are seen during each clinic.   [facility]  [address] Ether Griffins [place], [place] [phone]   Eligibility Requirements You must have lived in [place], [place], or [place] counties for at least the last three months.   You  cannot be eligible for state or federal sponsored [place], including [employer], [place], or [employer].   You generally cannot be eligible for healthcare insurance through your employer.    How to apply: Eligibility screenings are held every Tuesday and Wednesday afternoon from 1:00 pm until 4:00 pm. You do not need an appointment for the interview!  [facility] [address] [place], [place] [phone]   [facility] Health Department  [phone]   [facility] Health Department  [phone]   [facility] Health Department  [phone]    Behavioral Health Resources in the Community: Intensive Outpatient Programs Organization         Address  Phone  Notes  [facility] [address] [place], [place] [phone]   [facility] Outpatient [address], [place], [place] [phone]   [facility] [address], [place], [place]  [phone]   [facility] Mental Health [address]  [place], [place] [phone] or [phone]   Substance Abuse Resources Organization         Address  Phone  Notes  [facility] Drug Services  [phone]   [facility]  [phone]   [facility]  [phone]   Floydene Flock  [phone]   Residential & Outpatient Substance Abuse Program  [phone]   Psychological Services Organization         Address  Phone  Notes  [facility] Behavioral Health  [phone]   [facility] Services  [phone]   [facility] Mental Health [address], [place] [phone] or [phone]    Mobile Crisis Teams Organization         Address  Phone  Notes  [facility], Mobile Crisis Care Unit  [phone]   [facility]  [address] [place], [place] [phone]   Doristine Locks [address] [place] [phone]    Self-Help/Support Groups Organization         Address  Phone             Notes  [facility] of [place] - variety of support groups  336- I7437963 Call for more information  Narcotics Anonymous (NA), [facility] [address], [employer] [place]  2 meetings at this location   Statistician         Address  Phone  Notes  [facility] [address],    [place]  [phone]   [facility]  [address], [place] [address], [place], [place] [phone]   [facility] [address] [place], [place] [phone] Admissions: 8am-3pm M-F  [facility] [address],    [place], [place] [phone]   [facility] [address] [place], [place], [place] [phone]   [facility] [address],  [place], [place] [phone]   [facility] - Intensive Outpatient [address]ure[address], [place], [place] [phone]   [facility] (Addiction Recovery [facility] [address] [place]  [place], [place] [phone] or [phone]   [facility] ([facility]) [address], Charels Stambaugh [place], [place] [phone] Accepts Medicaid  [facility] [address]  [place] [phone] Substance Abuse/Addiction Treatment   [facility] Organization         Address  Phone  Notes  [facility]  [phone]   Angie Fava, PhD [address] [place], [place]   [phone] or  [phone]) [phone]   [facility]   [address] [place], [place] [phone]   [facility] [address] [place], [place] [phone] Insurance/Medicaid/sponsorship through [facility] and Families [address]                                    [place], [place] [phone] Therapy/tele-psych/case  [facility] [address]   [place], [place] [phone]    Dr. Lolly Mustache  [phone]   [facility] [place]  [employer] [facility] Dept. 1) [address] [place] 2) [address], [place] 3)  [address], [place] [phone]  [phone]   [facility] Child Abuse Hotline 437 735 5780 or [phone] (After Hours)

## 2013-03-16 NOTE — ED Provider Notes (Signed)
CSN: [id number]     Arrival date & time 03/16/13  1038 History   First MD Initiated Contact with Patient 03/16/13 1247     Chief Complaint  Patient presents with  . Abdominal Pain     (Consider location/radiation/quality/duration/timing/severity/associated sxs/prior Treatment) The history is provided by the patient.   Patient presents with several symptoms that have all currently resolved.  States she developed full body itching x 3-4 days.  Last night developed crampy lower abdominal pain that continued from last night until this morning but is now resolved.  States she was hot and lightheaded while this was going on.  Reports it was not unlike menstrual cramps, which she gets usually before her period. Denies current abdominal pain.  Denies N/V/D, urinary or vaginal symptoms.  LMP Jan 12.  Periods are usually irregular.  Pt states she came in because the last time she felt like this she "had a virus" and she wants to make sure she doesn't have another virus.   No known sick contacts.    Past Medical History  Diagnosis Date  . Asthma    History reviewed. No pertinent past surgical history. No family history on file. History  Substance Use Topics  . Smoking status: Never Smoker   . Smokeless tobacco: Not on file  . Alcohol Use: Yes   OB History   Grav Para Term Preterm Abortions TAB SAB Ect Mult Living                 Review of Systems  Constitutional: Negative for fever.  Respiratory: Negative for cough and shortness of breath.   Cardiovascular: Negative for chest pain.  Gastrointestinal: Positive for abdominal pain (resolved). Negative for nausea, vomiting, diarrhea, constipation and blood in stool.  Genitourinary: Negative for dysuria, urgency, frequency, vaginal bleeding, vaginal discharge and menstrual problem.  Skin: Negative for color change and rash.  All other systems reviewed and are negative.      Allergies  Review of patient's allergies indicates no known  allergies.  Home Medications   Current Outpatient Rx  Name  Route  Sig  Dispense  Refill  . albuterol (PROVENTIL HFA;VENTOLIN HFA) 108 (90 BASE) MCG/ACT inhaler   Inhalation   Inhale 1-2 puffs into the lungs every 6 (six) hours as needed for wheezing or shortness of breath.          BP 106/68  Pulse 88  Temp(Src) 99.8 F (37.7 C) (Oral)  Resp 16  Ht 5\' 2"  (1.575 m)  Wt 163 lb 6.4 oz (74.118 kg)  BMI 29.88 kg/m2  SpO2 99% Physical Exam  Nursing note and vitals reviewed. Constitutional: She appears well-developed and well-nourished. No distress.  HENT:  Head: Normocephalic and atraumatic.  Neck: Neck supple.  Cardiovascular: Normal rate and regular rhythm.   Pulmonary/Chest: Effort normal and breath sounds normal. No respiratory distress. She has no wheezes. She has no rales.  Abdominal: Soft. She exhibits no distension and no mass. There is no tenderness. There is no rebound, no guarding and no CVA tenderness.  Neurological: She is alert.  Skin: She is not diaphoretic.    ED Course  Procedures (including critical care time) Labs Review Labs Reviewed  URINALYSIS, ROUTINE W REFLEX MICROSCOPIC - Abnormal; Notable for the following:    APPearance CLOUDY (*)    Specific Gravity, Urine 1.031 (*)    Leukocytes, UA TRACE (*)    All other components within normal limits  CBC WITH DIFFERENTIAL - Abnormal; Notable for the following:  Neutrophils Relative % 80 (*)    Lymphocytes Relative 9 (*)    Lymphs Abs 0.6 (*)    All other components within normal limits  COMPREHENSIVE METABOLIC PANEL - Abnormal; Notable for the following:    Glucose, Bld 101 (*)    All other components within normal limits  URINE MICROSCOPIC-ADD ON - Abnormal; Notable for the following:    Squamous Epithelial / LPF FEW (*)    Bacteria, UA MANY (*)    All other components within normal limits  URINE CULTURE  POCT PREGNANCY, URINE   Imaging Review No results found.  EKG Interpretation   None       2:33 PM Patient reports she feels better after napping in ED.  Denies lightheadedness with standing.   MDM   Final diagnoses:  Abdominal cramping   Pt c/o abdominal pain that resolved by the time I saw her in the ED.  Exam unremarkable.  Abdominal exam benign.  Labs unremarkable.  UA with many bacteria but only 3-6 WBC and no symptoms except limited lower abdominal cramping.  Will send for culture. Given lower abdominal cramping, I would treat if the culture comes back positive. Discussed result, findings, treatment, and follow up  with patient.  Pt given return precautions.  Pt verbalizes understanding and agrees with plan.        GertyEmily Shiloh Swopes, PA-C 03/16/13 [phone]

## 2013-03-17 LAB — URINE CULTURE

## 2013-03-20 NOTE — ED Provider Notes (Signed)
Medical screening examination/treatment/procedure(s) were performed by non-physician practitioner and as supervising physician I was immediately available for consultation/collaboration.   Payden Bonus Y. Jerusalen Mateja, MD 03/20/13 1459 

## 2013-08-08 ENCOUNTER — Encounter (HOSPITAL_COMMUNITY): Payer: Self-pay | Admitting: Emergency Medicine

## 2013-08-08 ENCOUNTER — Emergency Department (HOSPITAL_COMMUNITY): Payer: Self-pay

## 2013-08-08 ENCOUNTER — Emergency Department (HOSPITAL_COMMUNITY)
Admission: EM | Admit: 2013-08-08 | Discharge: 2013-08-08 | Disposition: A | Discharge status: Home / Self Care | Payer: Self-pay | Attending: Emergency Medicine | Admitting: Emergency Medicine

## 2013-08-08 DIAGNOSIS — S93402A Sprain of unspecified ligament of left ankle, initial encounter: Secondary | ICD-10-CM

## 2013-08-08 DIAGNOSIS — J45909 Unspecified asthma, uncomplicated: Secondary | ICD-10-CM | POA: Insufficient documentation

## 2013-08-08 DIAGNOSIS — X500XXA Overexertion from strenuous movement or load, initial encounter: Secondary | ICD-10-CM | POA: Insufficient documentation

## 2013-08-08 DIAGNOSIS — Y9289 Other specified places as the place of occurrence of the external cause: Secondary | ICD-10-CM | POA: Insufficient documentation

## 2013-08-08 DIAGNOSIS — Z79899 Other long term (current) drug therapy: Secondary | ICD-10-CM | POA: Insufficient documentation

## 2013-08-08 DIAGNOSIS — S93409A Sprain of unspecified ligament of unspecified ankle, initial encounter: Secondary | ICD-10-CM | POA: Insufficient documentation

## 2013-08-08 DIAGNOSIS — R296 Repeated falls: Secondary | ICD-10-CM | POA: Insufficient documentation

## 2013-08-08 DIAGNOSIS — Y9389 Activity, other specified: Secondary | ICD-10-CM | POA: Insufficient documentation

## 2013-08-08 MED ORDER — ACETAMINOPHEN 325 MG PO TABS: 325.0000 mg | ORAL_TABLET | Freq: Four times a day (QID) | ORAL | Status: DC | PRN

## 2013-08-08 NOTE — Discharge Instructions (Signed)
Please call your doctor for a followup appointment within 24-48 hours. When you talk to your doctor please let them know that you were seen in the emergency department and have them acquire all of your records so that they can discuss the findings with you and formulate a treatment plan to fully care for your new and ongoing problems. Please call and set up an appointment with orthopedics to be reassessed regarding foot injury Please rest and stay hydrated Please take medications as prescribed Please keep foot and ankle brace at all times especially when active-please use crutches Please continue to monitor symptoms closely and if symptoms are to worsen or change (fever greater than 101, chills, chest pain, shortness of breath, difficulty breathing, numbness, tingling, worsening or changes to pain pattern, swelling, fall, injury, loss of sensation, red streaks running up the foot) please report back to the ED immediately   Ankle Sprain An ankle sprain is an injury to the strong, fibrous tissues (ligaments) that hold the bones of your ankle joint together.  CAUSES An ankle sprain is usually caused by a fall or by twisting your ankle. Ankle sprains most commonly occur when you step on the outer edge of your foot, and your ankle turns inward. People who participate in sports are more prone to these types of injuries.  SYMPTOMS   Pain in your ankle. The pain may be present at rest or only when you are trying to stand or walk.  Swelling.  Bruising. Bruising may develop immediately or within 1 to 2 days after your injury.  Difficulty standing or walking, particularly when turning corners or changing directions. DIAGNOSIS  Your caregiver will ask you details about your injury and perform a physical exam of your ankle to determine if you have an ankle sprain. During the physical exam, your caregiver will press on and apply pressure to specific areas of your foot and ankle. Your caregiver will try to  move your ankle in certain ways. An X-ray exam may be done to be sure a bone was not broken or a ligament did not separate from one of the bones in your ankle (avulsion fracture).  TREATMENT  Certain types of braces can help stabilize your ankle. Your caregiver can make a recommendation for this. Your caregiver may recommend the use of medicine for pain. If your sprain is severe, your caregiver may refer you to a surgeon who helps to restore function to parts of your skeletal system (orthopedist) or a physical therapist. HOME CARE INSTRUCTIONS   Apply ice to your injury for 1-2 days or as directed by your caregiver. Applying ice helps to reduce inflammation and pain.  Put ice in a plastic bag.  Place a towel between your skin and the bag.  Leave the ice on for 15-20 minutes at a time, every 2 hours while you are awake.  Only take over-the-counter or prescription medicines for pain, discomfort, or fever as directed by your caregiver.  Elevate your injured ankle above the level of your heart as much as possible for 2-3 days.  If your caregiver recommends crutches, use them as instructed. Gradually put weight on the affected ankle. Continue to use crutches or a cane until you can walk without feeling pain in your ankle.  If you have a plaster splint, wear the splint as directed by your caregiver. Do not rest it on anything harder than a pillow for the first 24 hours. Do not put weight on it. Do not get it wet.  You may take it off to take a shower or bath.  You may have been given an elastic bandage to wear around your ankle to provide support. If the elastic bandage is too tight (you have numbness or tingling in your foot or your foot becomes cold and blue), adjust the bandage to make it comfortable.  If you have an air splint, you may blow more air into it or let air out to make it more comfortable. You may take your splint off at night and before taking a shower or bath. Wiggle your toes in the  splint several times per day to decrease swelling. SEEK MEDICAL CARE IF:   You have rapidly increasing bruising or swelling.  Your toes feel extremely cold or you lose feeling in your foot.  Your pain is not relieved with medicine. SEEK IMMEDIATE MEDICAL CARE IF:  Your toes are numb or blue.  You have severe pain that is increasing. MAKE SURE YOU:   Understand these instructions.  Will watch your condition.  Will get help right away if you are not doing well or get worse. Document Released: 01/15/2005 Document Revised: 10/10/2011 Document Reviewed: 01/27/2011 [facility] Patient Information 2015 [place], [place]. This information is not intended to replace advice given to you by your health care provider. Make sure you discuss any questions you have with your health care provider.   Emergency Department Resource Guide 1) Find a Doctor and Pay Out of Pocket Although you won't have to find out who is covered by your insurance plan, it is a good idea to ask around and get recommendations. You will then need to call the office and see if the doctor you have chosen will accept you as a new patient and what types of options they offer for patients who are self-pay. Some doctors offer discounts or will set up payment plans for their patients who do not have insurance, but you will need to ask so you aren't surprised when you get to your appointment.  2) Contact Your Local Health Department Not all health departments have doctors that can see patients for sick visits, but many do, so it is worth a call to see if yours does. If you don't know where your local health department is, you can check in your phone book. The CDC also has a tool to help you locate your state's health department, and many state websites also have listings of all of their local health departments.  3) Find a Walk-in Clinic If your illness is not likely to be very severe or complicated, you may want to try a walk in clinic.  These are popping up all over the country in pharmacies, drugstores, and shopping centers. They're usually staffed by nurse practitioners or physician assistants that have been trained to treat common illnesses and complaints. They're usually fairly quick and inexpensive. However, if you have serious medical issues or chronic medical problems, these are probably not your best option.  No Primary Care Doctor: - Call Health Connect at  [phone] - they can help you locate a primary care doctor that  accepts your insurance, provides certain services, etc. - Physician Referral Service- [phone]  Chronic Pain Problems: Organization         Address  Phone   Notes  [facility]  [phone] Patients need to be referred by their primary care doctor.   Medication Assistance: Retail buyerrganization         Address  Phone  Notes  [facility] Medication [facility] [address] [place] [place], [place] [address] [phone] --Must be a resident of [facility] -- Must have NO insurance coverage whatsoever (no Medicaid/ Medicare, etc.) -- The pt. MUST have a primary care doctor that directs their care regularly and follows them in the community   [facility]  [phone]   [employer]  [phone]    Agencies that provide inexpensive medical care: Organization         Address  Phone   Notes  [facility]  [phone]   [facility]    [phone]   [facility] [address] [facility] [address] [place], [place] [address] [phone]   [facility] [address] [place], [place] [phone]   Planned Parenthood    [phone]   [facility]    [phone]   [facility]  [address], [place] Phone:  [phone], Fax:  [phone] Hours of Operation:  9 am - 6 pm, M-F.  Also accepts Medicaid/Medicare and self-pay.  [facility]  [address], [place] Phone: [phone], Fax: [phone]. Hours of Operation:  8:30 am - 5:30 pm, M-F.  Also accepts Medicaid and self-pay.  [facility] [place] [address] [place] Phone: [phone]   [facility] [address] Natasha Bence [place], [place] [phone], Ext. [phone] Mondays & Thursdays: 7-9 AM.  First 15 patients are seen on a first come, first serve basis.    Medicaid-accepting [facility] Providers:  Organization         Address  Phone   Notes  [facility] [address], [place] [phone] Also accepts self-pay patients.  [facility] [address] Laurell Josephs [place], [place]  [phone]   [facility] [address], [place] [phone]   [facility] [address], [place] [phone]   Renaye Rakers [address], [place]   [phone] Only accepts [place] Access [place] patients after they have their name applied to their card.   Self-Pay (no insurance) in [facility]:  Organization         Address  Phone   Notes  Sickle Cell Patients, [facility] Internal Medicine [address] [place], [place] [phone]   [facility] [address] [place], [place] [phone]   [facility]  [address], [place] [phone]   [facility]/Dr. Osei-Bonsu  [address] [place] or [address], [place] [phone] Phone number for both [place] and [place] locations is the same.  [facility] [address] [place] [phone]   [facility] [address] [place] or [address] [phone]   [facility] [address], [place] [phone], phone; [phone], fax Sees patients 1st  and 3rd Saturday of every month.  Must not qualify for public or private insurance (i.e. Medicaid, Medicare, [place] Health Choice, Veterans' Benefits)  Household income should be no more than 200% of the poverty level The clinic cannot treat you if you are pregnant or think you are pregnant  Sexually transmitted  diseases are not treated at the clinic.    Dental Care: Organization         Address  Phone  Notes  [facility] Department of [facility] [address] [place], [place] [phone] Accepts children up to age 39 who are enrolled in [place] or [place] Health Choice; pregnant women with a Medicaid card; and children who have applied for Medicaid or [place] Health Choice, but were declined, whose parents can pay a reduced fee at time of service.  [facility] Department of [facility]  [address], [place] [phone] Accepts children up to age 58 who are enrolled in [place] or [place] Health Choice; pregnant women with a Medicaid card; and children who have applied for Medicaid or  Health Choice, but were declined, whose parents can pay a reduced fee at time of service.  [facility]  [address] [place], [place] [phone] Patients are seen by appointment only. Walk-ins are not accepted. [facility] will see patients 22 years of age and older. Monday - Tuesday (8am-5pm) Most Wednesdays (8:30-5pm) $30 per visit, cash only  [facility]  [address], [facility] [phone] Patients are seen by appointment only. Walk-ins are not accepted. [facility] will see patients 30 years of age and older. One Wednesday Evening (Monthly: Volunteer Based).  $30 per visit, cash only  [employer]  [phone] for adults; Children under age 8, call Graduate Pediatric Dentistry at [phone]. Children aged 84-14, please call [phone] to request a pediatric  application.  Dental services are provided in all areas of dental care including fillings, crowns and bridges, complete and partial dentures, implants, gum treatment, root canals, and extractions. Preventive care is also provided. Treatment is provided to both adults and children. Patients are selected via a lottery and there is often a waiting list.   [facility] [address], [place]  [phone] [email or web address]   [facility] [address] [place], [place] [phone], Ext. [phone] Second and Fourth Thursday of each month, opens at 6:30 AM; Clinic ends at 9 AM.  Patients are seen on a first-come first-served basis, and a limited number are seen during each clinic.   [facility]  [address] Ether Griffins [place], [place] [phone]   Eligibility Requirements You must have lived in [place], [place], or [place] counties for at least the last three months.   You cannot be eligible for state or federal sponsored [place], including [employer], [place], or [employer].   You generally cannot be eligible for healthcare insurance through your employer.    How to apply: Eligibility screenings are held every Tuesday and Wednesday afternoon from 1:00 pm until 4:00 pm. You do not need an appointment for the interview!  [facility] [address] [place], [place] [phone]   [facility] Health Department  [phone]   [facility] Health Department  [phone]   [facility] Health Department  [phone]    Behavioral Health Resources in the Community: Intensive Outpatient Programs Organization         Address  Phone  Notes  [facility] [address], [place], [place] [phone]   [facility] Outpatient [address] [place], [place] [phone]   [facility] [address] [place], [place]  [phone]  [facility]  [address],  [place], [place] [phone] or [phone]   Substance Abuse Resources Organization         Address  Phone  Notes  [facility]  [phone]   [facility]  [phone]   [facility]  [phone]   Floydene Flock  [phone]   Residential & Outpatient Substance Abuse Program  [phone]   Psychological Services Organization         Address  Phone  Notes  [facility] Behavioral Health  [phone]   [facility]  [phone]   [facility] Mental Health [address], [place] [phone] or [phone]    Mobile Crisis Teams Organization         Address  Phone  Notes  [facility], Mobile Crisis Care Unit  [phone]   [facility]  [address] [place], [place] [phone]   Doristine Locks [address] [place] [phone]    Self-Help/Support Groups Organization         Address  Phone             Notes  Mental Health Assoc. of  - variety of support groups  336- I7437963 Call for more information  Narcotics Anonymous (NA), [facility] [address], [employer] [place]  2 meetings at this location   Statistician         Address  Phone  Notes  [facility] [address],    [place]  [phone]   [facility]  [address] [place] [address], [place], [place] [phone]   [facility] [address] [place], [place] [phone] Admissions: 8am-3pm M-F  [facility] [address]    [place], [place] [phone]   [facility] [address] [place], [place], [place] [phone]   [facility] [address],  [place], [place] [phone]   [facility] - Intensive Outpatient [address] Laurell Josephs [address], [place], [place] [phone]   [facility] (Addiction [facility] [address] [place].,    [place], [place] [phone] or [phone]   [facility] ([facility]) [address], [place], [place] [phone] Accepts Medicaid  [facility] [address]  [place] [phone] Substance Abuse/Addiction Treatment   [facility] Organization         Address  Phone  Notes  [facility]  [phone]   Angie Fava, PhD [address] Ervin Knack [place], [place]   [phone] or [phone]   [facility] Behavioral   [address] [place], [place] [phone]   [facility] [address] [place], [place] [phone] Insurance/Medicaid/sponsorship through [facility] and Families [address]                                    [place], [place] [phone] Therapy/tele-psych/case  [facility] [address], [place] [phone]    Dr. Lolly Mustache  [phone]   [facility]  [employer] [facility] Dept. 1) [address] [place] 2) [address] [place] 3)  [address], [place] [phone]  [phone]   [facility] Child Abuse Hotline 640-441-2732 or [phone] (After  Hours)

## 2013-08-08 NOTE — ED Provider Notes (Signed)
CSN: [id number]     Arrival date & time 08/08/13  1043 History  This chart was scribed for non-physician practitioner, Raymon MuttonMarissa Libi Corso, PA-C,working with Dagmar HaitWilliam Blair Walden, MD, by Karle PlumberJennifer Tensley, ED Scribe.  This patient was seen in room TR05C/TR05C and the patient's care was started at 11:57 AM.  Chief Complaint  Patient presents with  . Ankle Pain   The history is provided by the patient. No language interpreter was used.   HPI Comments:  Doristine DevoidSemaj Kinzler is a 28 y.o. female who presents to the Emergency Department complaining of sharp, throbbing left ankle pain onset last night. She reports radiation of the pain up her leg. She reports associated swelling last night that has now resolved. She states the pain started after twisting and falling in a parking lot. She states she sprained this same ankle last year. She ace wrapped the ankle and elevated it with a pillow. She states the pain worsens with pressure. Pt has not taken anything for the pain. She denies numbness, tingling or weakness of the extremity. She states her LMP began 07/13/13.  Past Medical History  Diagnosis Date  . Asthma    History reviewed. No pertinent past surgical history. History reviewed. No pertinent family history. History  Substance Use Topics  . Smoking status: Never Smoker   . Smokeless tobacco: Not on file  . Alcohol Use: Yes   OB History   Grav Para Term Preterm Abortions TAB SAB Ect Mult Living                 Review of Systems  Musculoskeletal: Positive for arthralgias.  Neurological: Negative for weakness and numbness.    Allergies  Review of patient's allergies indicates no known allergies.  Home Medications   Prior to Admission medications   Medication Sig Start Date End Date Taking? Authorizing Provider  acetaminophen (TYLENOL) 325 MG tablet Take 1 tablet (325 mg total) by mouth every 6 (six) hours as needed. 08/08/13   Sevag Shearn, PA-C  albuterol (PROVENTIL HFA;VENTOLIN HFA) 108 (90  BASE) MCG/ACT inhaler Inhale 1-2 puffs into the lungs every 6 (six) hours as needed for wheezing or shortness of breath.    Historical Provider, MD   Triage Vitals: BP 144/114  Pulse 77  Temp(Src) 98.7 F (37.1 C) (Oral)  Resp 16  Ht 5\' 2"  (1.575 m)  Wt 161 lb (73.029 kg)  BMI 29.44 kg/m2  SpO2 100%  LMP 07/13/2013 Physical Exam  Nursing note and vitals reviewed. Constitutional: She is oriented to person, place, and time. She appears well-developed and well-nourished.  HENT:  Head: Normocephalic and atraumatic.  Mouth/Throat: Oropharynx is clear and moist. No oropharyngeal exudate.  Eyes: Conjunctivae and EOM are normal. Pupils are equal, round, and reactive to light. Right eye exhibits no discharge. Left eye exhibits no discharge.  Neck: Normal range of motion. Neck supple. No tracheal deviation present.  Cardiovascular: Normal rate, regular rhythm and normal heart sounds.  Exam reveals no friction rub.   No murmur heard. Pulses:      Radial pulses are 2+ on the right side, and 2+ on the left side.       Dorsalis pedis pulses are 2+ on the right side, and 2+ on the left side.       Posterior tibial pulses are 2+ on the right side, and 2+ on the left side.  Cap refill less than 3 seconds  Pulmonary/Chest: Effort normal and breath sounds normal. No respiratory distress. She has no wheezes.  She has no rales.  Musculoskeletal: She exhibits tenderness.       Left ankle: She exhibits no swelling, no ecchymosis, no deformity and no laceration. Tenderness. AITFL tenderness found.       Feet:  Negative swelling identified to the ankle. Negative tremors or malalignment. Negative erythema, inflammation, lesions, sores, ecchymosis identified to the left ankle. Negative warmth upon palpation. Warmth upon palpation to the lateral aspect of the left ankle-talofibular ligament region. Decreased dorsiflexion and plantarflexion secondary to pain, but patient able to move the ankle in this manner. Full  inversion eversion noted. Full range of motion to the digits of left foot  Lymphadenopathy:    She has no cervical adenopathy.  Neurological: She is alert and oriented to person, place, and time. No cranial nerve deficit. She exhibits normal muscle tone. Coordination normal.  Cranial nerves III-XII grossly intact Strength 5+/5+ to lower extremities bilaterally with resistance applied, equal distribution noted Strength intact to digits bilaterally Sensation intact with differentiation to sharp and dull touch  Skin: Skin is warm and dry.  Psychiatric: She has a normal mood and affect. Her behavior is normal.    ED Course  Procedures (including critical care time) DIAGNOSTIC STUDIES: Oxygen Saturation is 100% on RA, normal by my interpretation.   COORDINATION OF CARE: 12:02 PM- Will X-Ray left ankle and foot. Pt verbalizes understanding and agrees to plan.  Medications - No data to display  Labs Review Labs Reviewed - No data to display  Imaging Review Dg Ankle Complete Left  08/08/2013   CLINICAL DATA:  Left ankle pain following injury  EXAM: LEFT ANKLE COMPLETE - 3+ VIEW  COMPARISON:  09/21/2012  FINDINGS: There is no evidence of fracture, dislocation, or joint effusion. There is no evidence of arthropathy or other focal bone abnormality. Soft tissues are unremarkable.  IMPRESSION: No acute abnormality noted.   Electronically Signed   By: Alcide Clever M.D.   On: 08/08/2013 12:26   Dg Foot Complete Left  08/08/2013   CLINICAL DATA:  Left foot pain  EXAM: LEFT FOOT - COMPLETE 3+ VIEW  COMPARISON:  None.  FINDINGS: There is no evidence of fracture or dislocation. There is no evidence of arthropathy or other focal bone abnormality. Soft tissues are unremarkable.  IMPRESSION: No acute abnormality noted.   Electronically Signed   By: Alcide Clever M.D.   On: 08/08/2013 12:24     EKG Interpretation None      MDM   Final diagnoses:  Left ankle sprain, initial encounter    Filed  Vitals:   08/08/13 1105 08/08/13 1246  BP: 144/114 99/66  Pulse: 77 67  Temp: 98.7 F (37.1 C)   TempSrc: Oral   Resp: 16 18  Height: 5\' 2"  (1.575 m)   Weight: 161 lb (73.029 kg)   SpO2: 100% 100%   I personally performed the services described in this documentation, which was scribed in my presence. The recorded information has been reviewed and is accurate.  Plain film of left foot negative for acute osseous injury. Plain film of left ankle negative for. No evidence of fracture, dislocation or joint effusion. Soft tissues unremarkable. Negative focal neurological deficits noted. Strength intact to digits of the feet bilaterally. Sensation intact with differentiation sharp and dull touch. Strength intact to the digits. Pulses palpable and strong DP and PT. Cap refill less than 3 seconds. Imaging unremarkable. Doubt fracture. Doubt compartment syndrome. Doubt ischemia. Suspicion to be ankle sprain-suspicion to be pallor fibular ligament sprain.  Patient placed in ASO brace and crutches given for comfort. Patient stable, afebrile. Patient not septic appearing. Discharged patient. [facility] and [facility]ics. Discussed with patient to rest, ice, elevate. Discussed with patient to closely monitor symptoms and if symptoms are to worsen or change to report back to the ED - strict return instructions given.  Patient agreed to plan of care, understood, all questions answered.   Raymon Mutton, PA-C 08/08/13 1836

## 2013-08-08 NOTE — ED Notes (Signed)
Pt is aware of need of urine specimen; she cannot go at this time

## 2013-08-08 NOTE — ED Notes (Signed)
PT reports falling last night and now has Lt ankle pain. Ain is 10/10

## 2013-08-08 NOTE — ED Notes (Signed)
Declined W/C at D/C and was escorted to lobby by RN. 

## 2013-08-09 NOTE — ED Provider Notes (Signed)
Medical screening examination/treatment/procedure(s) were performed by non-physician practitioner and as supervising physician I was immediately available for consultation/collaboration.   EKG Interpretation None        William Wende Longstreth, MD 08/09/13 1522 

## 2014-02-06 ENCOUNTER — Encounter (HOSPITAL_COMMUNITY): Payer: Self-pay | Admitting: Emergency Medicine

## 2014-02-06 ENCOUNTER — Emergency Department (HOSPITAL_COMMUNITY)
Admission: EM | Admit: 2014-02-06 | Discharge: 2014-02-06 | Disposition: A | Discharge status: Home / Self Care | Payer: Self-pay | Attending: Emergency Medicine | Admitting: Emergency Medicine

## 2014-02-06 DIAGNOSIS — R1110 Vomiting, unspecified: Secondary | ICD-10-CM | POA: Insufficient documentation

## 2014-02-06 DIAGNOSIS — J029 Acute pharyngitis, unspecified: Secondary | ICD-10-CM | POA: Insufficient documentation

## 2014-02-06 DIAGNOSIS — H9209 Otalgia, unspecified ear: Secondary | ICD-10-CM | POA: Insufficient documentation

## 2014-02-06 DIAGNOSIS — Z79899 Other long term (current) drug therapy: Secondary | ICD-10-CM | POA: Insufficient documentation

## 2014-02-06 LAB — RAPID STREP SCREEN (MED CTR MEBANE ONLY): Streptococcus, Group A Screen (Direct): NEGATIVE

## 2014-02-06 MED ORDER — DEXAMETHASONE 10 MG/ML FOR PEDIATRIC ORAL USE
10.0000 mg | Freq: Once | INTRAMUSCULAR | Status: AC
  Administered 2014-02-06: 10 mg via ORAL
  Filled 2014-02-06: qty 1

## 2014-02-06 MED ORDER — IBUPROFEN 800 MG PO TABS
800.0000 mg | ORAL_TABLET | Freq: Once | ORAL | Status: AC
  Administered 2014-02-06: 800 mg via ORAL
  Filled 2014-02-06: qty 1

## 2014-02-06 NOTE — ED Provider Notes (Signed)
CSN: [id number]     Arrival date & time 02/06/14  1936 History  This chart was scribed for non-physician practitioner, Girard CooterJoseph A Laiklyn Pilkenton, PA-C, working with Gerhard Munchobert Lockwood, MD, by Modena JanskyAlbert Thayil, ED Scribe. This patient was seen in room WTR5/WTR5 and the patient's care was started at 7:47 PM.    Chief Complaint  Patient presents with  . Sore Throat  . Cough   The history is provided by the patient. No language interpreter was used.    HPI Comments: Kristin Massey is a 29 y.o. female who presents to the Emergency Department complaining of a constant moderate sore throat that started 3 days ago. She reports that she got sick and had constant moderate bilateral ear pain also. She states that she developed a nonproductive intermittent moderate cough with post-tussive emesis. She reports that she has a hx of asthma. She denies any fever, nasal congestion, or wheezing.   Past Medical History  Diagnosis Date  . Asthma    History reviewed. No pertinent past surgical history. History reviewed. No pertinent family history. History  Substance Use Topics  . Smoking status: Never Smoker   . Smokeless tobacco: Not on file  . Alcohol Use: Yes   OB History    No data available     Review of Systems  Constitutional: Negative for fever.  HENT: Positive for ear pain and sore throat. Negative for congestion.   Respiratory: Positive for cough. Negative for wheezing.   Gastrointestinal: Positive for vomiting (post-tussive).    Allergies  Review of patient's allergies indicates no known allergies.  Home Medications   Prior to Admission medications   Medication Sig Start Date End Date Taking? Authorizing Provider  acetaminophen (TYLENOL) 325 MG tablet Take 1 tablet (325 mg total) by mouth every 6 (six) hours as needed. 08/08/13   Marissa Sciacca, PA-C  albuterol (PROVENTIL HFA;VENTOLIN HFA) 108 (90 BASE) MCG/ACT inhaler Inhale 1-2 puffs into the lungs every 6 (six) hours as needed for wheezing or  shortness of breath.    Historical Provider, MD   BP 122/80 mmHg  Pulse 80  Temp(Src) 99.3 F (37.4 C) (Oral)  Resp 16  SpO2 100% Physical Exam  Constitutional: She is oriented to person, place, and time. She appears well-developed and well-nourished. No distress.  HENT:  Head: Normocephalic and atraumatic.  Right Ear: Tympanic membrane normal.  Left Ear: Tympanic membrane normal.  Mouth/Throat: No oropharyngeal exudate.  No erythema or edema to nasal turbinates.  Mild tonsillar erythema, no obvious swelling or exudates.  Mild cervical adenopathy.   Neck: Neck supple. No tracheal deviation present.  Cardiovascular: Normal rate.   Pulmonary/Chest: Effort normal. No respiratory distress.  Musculoskeletal: Normal range of motion.  Lymphadenopathy:    She has cervical adenopathy.  Neurological: She is alert and oriented to person, place, and time.  Skin: Skin is warm and dry.  Psychiatric: She has a normal mood and affect. Her behavior is normal.  Nursing note and vitals reviewed.   ED Course  Procedures (including critical care time) DIAGNOSTIC STUDIES: Oxygen Saturation is 100% on RA, normal by my interpretation.    COORDINATION OF CARE: 7:51 PM- Pt advised of plan for treatment which includes medication and labs and pt agrees.  Labs Review Labs Reviewed  RAPID STREP SCREEN  CULTURE, GROUP A STREP    Imaging Review No results found.   EKG Interpretation None      MDM   Final diagnoses:  Viral pharyngitis    Pt afebrile without  tonsillar exudate, negative strep. Presents with mild cervical lymphadenopathy, & dysphagia; diagnosis of viral pharyngitis. No abx indicated. DC w symptomatic tx for pain  Pt does not appear dehydrated, but did discuss importance of water rehydration. Presentation non concerning for PTA or infxn spread to soft tissue. No trismus or uvula deviation. Specific return precautions discussed. Pt able to drink water in ED without difficulty  with intact air way. Recommended PCP follow up. I provided patient with resource guide to help her find a PCP. I discussed return precautions with patient, and she verbalized understanding and agreement of this plan. I encouraged patient to call or return to the ER should she have any questions or concerns.  BP 122/80 mmHg  Pulse 80  Temp(Src) 99.3 F (37.4 C) (Oral)  Resp 16  SpO2 100%  Signed,  Ladona Mow, PA-C 8:56 PM     Monte Fantasia, PA-C 02/06/14 2056  Gerhard Munch, MD 02/06/14 2244

## 2014-02-06 NOTE — ED Notes (Signed)
Pt states she has been experiencing cough, sore throat and ear pain since Wed. Pt states she has not taken any medication for symptoms. Pt is alert and oriented.

## 2014-02-06 NOTE — ED Notes (Signed)
Pt alert, oriented and ambulatory upon DC. She reports she is leaving with ALL belongings she arrived with. She was advised to obtain a PCP resource guide provided.

## 2014-02-06 NOTE — Discharge Instructions (Signed)
Pharyngitis °Pharyngitis is redness, pain, and swelling (inflammation) of your pharynx.  °CAUSES  °Pharyngitis is usually caused by infection. Most of the time, these infections are from viruses (viral) and are part of a cold. However, sometimes pharyngitis is caused by bacteria (bacterial). Pharyngitis can also be caused by allergies. Viral pharyngitis may be spread from person to person by coughing, sneezing, and personal items or utensils (cups, forks, spoons, toothbrushes). Bacterial pharyngitis may be spread from person to person by more intimate contact, such as kissing.  °SIGNS AND SYMPTOMS  °Symptoms of pharyngitis include:   °· Sore throat.   °· Tiredness (fatigue).   °· Low-grade fever.   °· Headache. °· Joint pain and muscle aches. °· Skin rashes. °· Swollen lymph nodes. °· Plaque-like film on throat or tonsils (often seen with bacterial pharyngitis). °DIAGNOSIS  °Your health care provider will ask you questions about your illness and your symptoms. Your medical history, along with a physical exam, is often all that is needed to diagnose pharyngitis. Sometimes, a rapid strep test is done. Other lab tests may also be done, depending on the suspected cause.  °TREATMENT  °Viral pharyngitis will usually get better in 3-4 days without the use of medicine. Bacterial pharyngitis is treated with medicines that kill germs (antibiotics).  °HOME CARE INSTRUCTIONS  °· Drink enough water and fluids to keep your urine clear or pale yellow.   °· Only take over-the-counter or prescription medicines as directed by your health care provider:   °¨ If you are prescribed antibiotics, make sure you finish them even if you start to feel better.   °¨ Do not take aspirin.   °· Get lots of rest.   °· Gargle with 8 oz of salt water (½ tsp of salt per 1 qt of water) as often as every 1-2 hours to soothe your throat.   °· Throat lozenges (if you are not at risk for choking) or sprays may be used to soothe your throat. °SEEK MEDICAL  CARE IF:  °· You have large, tender lumps in your neck. °· You have a rash. °· You cough up green, yellow-brown, or bloody spit. °SEEK IMMEDIATE MEDICAL CARE IF:  °· Your neck becomes stiff. °· You drool or are unable to swallow liquids. °· You vomit or are unable to keep medicines or liquids down. °· You have severe pain that does not go away with the use of recommended medicines. °· You have trouble breathing (not caused by a stuffy nose). °MAKE SURE YOU:  °· Understand these instructions. °· Will watch your condition. °· Will get help right away if you are not doing well or get worse. °Document Released: 01/15/2005 Document Revised: 11/05/2012 Document Reviewed: 09/22/2012 °ExitCare® Patient Information ©2015 [employer]. This information is not intended to replace advice given to you by your health care provider. Make sure you discuss any questions you have with your health care provider. ° ° °Emergency Department Resource Guide °1) Find a Doctor and Pay Out of Pocket °Although you won't have to find out who is covered by your insurance plan, it is a good idea to ask around and get recommendations. You will then need to call the office and see if the doctor you have chosen will accept you as a new patient and what types of options they offer for patients who are self-pay. Some doctors offer discounts or will set up payment plans for their patients who do not have insurance, but you will need to ask so you aren't surprised when you get to your appointment. ° °  2) Contact Your Local Health Department °Not all health departments have doctors that can see patients for sick visits, but many do, so it is worth a call to see if yours does. If you don't know where your local health department is, you can check in your phone book. The CDC also has a tool to help you locate your state's health department, and many state websites also have listings of all of their local health departments. ° °3) Find a Walk-in Clinic °If  your illness is not likely to be very severe or complicated, you may want to try a walk in clinic. These are popping up all over the country in pharmacies, drugstores, and shopping centers. They're usually staffed by nurse practitioners or physician assistants that have been trained to treat common illnesses and complaints. They're usually fairly quick and inexpensive. However, if you have serious medical issues or chronic medical problems, these are probably not your best option. ° °No Primary Care Doctor: °- Call [facility] at  832-8000 - they can help you locate a primary care doctor that  accepts your insurance, provides certain services, etc. °- Physician Referral Service- 1-800-533-3463 ° °Chronic Pain Problems: °Organization         Address  Phone   Notes  ° [facility]  (336) 297-2271 Patients need to be referred by their primary care doctor.  ° °Medication Assistance: °Organization         Address  Phone   Notes  °[place] [facility] [address] °[place], [place] [address] (336) 641-8030 --Must be a resident of [place] °-- Must have NO insurance coverage whatsoever (no Medicaid/ Medicare, etc.) °-- The pt. MUST have a primary care doctor that directs their care regularly and follows them in the community °  °[facility]  (866) 331-1348   °[employer]  (888) 892-1162   ° °Agencies that provide inexpensive medical care: °Organization         Address  Phone   Notes  °[facility]  (336) 832-8035   ° Internal Medicine    (336) 832-7272   °[facility] [address] °[place], [place] [address] (336) 832-4777   °[facility] [address], °[place] (336) 271-4999   °[facility]    (336) 373-0678   °[facility]    (336) 272-1050   °[facility] ° [address], [place] Phone:  (336) 832-4444, Fax:  (336) 832-4440 Hours of  Operation:  9 am - 6 pm, M-F.  Also accepts Medicaid/Medicare and self-pay.  °[facility] ° [address], [place] Phone: (336) 832-3150, Fax: (336) 832-3151. Hours of Operation:  8:30 am - 5:30 pm, M-F.  Also accepts Medicaid and self-pay.  °[facility] [place] [address], [place] Phone: (336) 878-6027   °[facility] [address], [place], [place] (336)723-1848, Ext. 123 Mondays & Thursdays: 7-9 AM.  First 15 patients are seen on a first come, first serve basis. °  ° °Medicaid-accepting [place] Providers: ° °Organization         Address  Phone   Notes  °[facility] [address], [place] (336) 641-2100 Also accepts self-pay patients.  °[facility] [address],  ° (336) 856-9996   °[facility] [address], [place] (336) 288-8857   °[facility] [address] [place] [address], [place] (336) 299-7000   °Veita Bland [address], [place]  ° (336) 373-1557 Only accepts [place] Access Medicaid patients after they have their name applied to their card.  ° °Self-Pay (no insurance) in [place]: ° °Organization         Address  Phone   Notes  °Sickle Cell Patients, [facility] [address], [place] (336) 832-1970   °[facility] [address], [place] (336) 832-4400   °[facility] ° [address], [place] (336) 992-4800   °[facility]/Dr. Osei-Bonsu ° [address] [place] [address], [place] or [address], [place] (336) 841-8500 Phone number for both [place] and [place] locations is the same.  °[facility] [address], [place] (336) 299-0000   °[facility] [address] [place] [address], [place] or [address] (336) 852-7530 °(336) 878-2260   °[facility] [address], [place] (336) 350-1642, phone; (336) 294-5005, fax Sees patients 1st and 3rd Saturday of every month.  Must not qualify for public or private insurance (i.e. Medicaid, Medicare, [place] Health Choice, Veterans' Benefits) • Household income should be no more than 200% of the poverty level •The clinic cannot treat you if you are pregnant or think you are pregnant • Sexually transmitted diseases are not treated at the clinic.  ° ° °Dental Care: °Organization         Address  Phone  Notes  °[facility] [facility] [address], [place] (336) 641-6152 Accepts children up to age 21 who are enrolled in Medicaid or [place] Health Choice; pregnant women with a Medicaid card; and children who have applied for Medicaid or [place] Health Choice, but were declined, whose parents can pay a reduced fee at time of service.  °[facility] [place]  [address], [place] (336) 641-7733 Accepts children up to age 21 who are enrolled in Medicaid or [place] Health Choice; pregnant women with a Medicaid card; and children who have applied for Medicaid or [place] Health Choice, but were declined, whose parents can pay a reduced fee at time of service.  °[facility] ° [address],  (336) 641-4533 Patients are seen by appointment only. Walk-ins are not accepted. [facility] will see patients 18 years of age and older. °Monday - Tuesday (8am-5pm) °Most Wednesdays (8:30-5pm) °$30 per visit, cash only  °[facility] ° [address], [place] (336) 641-4533 Patients are seen by appointment only. Walk-ins are not accepted. [facility] will see patients 18 years of age and older. °One Wednesday Evening (Monthly: Volunteer Based).  $30 per visit, cash only  °[facility]  (919) 537-3737 for adults; Children under age 4, call Graduate Pediatric  Dentistry at ([phone]) [phone]. Children aged 4-14, please call (919) [phone] to request a pediatric application. ° Dental services are provided in all areas of dental care including fillings, crowns and bridges, complete and partial dentures, implants, gum treatment, root canals, and extractions. Preventive care is also provided. Treatment is provided to both adults and children. °Patients are selected via a lottery and there is often a waiting list. °  °[facility] [address], °  [place] ° (336) 763-8833 [email or web address] °  °[facility] [address], [place], [place] (336)723-1848, Ext. 123 Second and Fourth Thursday of each month, opens at 6:30 AM; Clinic ends at 9 AM.  Patients are seen on a first-come first-served basis, and a limited number are seen during each clinic.  ° °[facility] ° [address], [place], [place] (336) 723-7904   Eligibility Requirements °You must have lived in [place], [place], or [place] counties for at least the last three months. °  You cannot be eligible for state or federal sponsored healthcare insurance, including Veterans Administration, Medicaid, or Medicare. °  You generally cannot be eligible for healthcare insurance through your employer.  °  How to apply: °Eligibility screenings are held every Tuesday and Wednesday afternoon from 1:00 pm until 4:00 pm. You do not need an appointment for the interview!  °[facility] [address], [place], [place] 336-631-2330   °[facility]  336-342-8273   °[facility]  336-703-3100   °[facility]  336-570-6415   ° °Behavioral Health Resources in the Community: °Intensive Outpatient Programs °Organization         Address  Phone  Notes  °[facility] [address], [place], [place] 336-878-6098   °[facility] [address], [place], [place] 336-832-9800   °[facility] [address], [place], [place] ° 336-882-2125   °[facility] [address],  °[place], [place] 1-800-853-5163 or 336-641-4981   °Substance Abuse Resources °Organization         Address  Phone  Notes  °[facility]  336-882-2125   °[facility]  336-784-9470   °[facility]  336-285-9073   °[facility]  336-845-3988   °Residential & Outpatient Substance Abuse Program  1-800-659-3381   °Psychological Services °Organization         Address  Phone  Notes  °[facility]  336- 832-9600   °[facility]  336- 378-7881   °[facility] [address], [place] 1-800-853-5163 or 336-641-4981   ° °Mobile Crisis Teams °Organization         Address  Phone  Notes  °[facility], Mobile Crisis Care Unit  1-877-626-1772   °Assertive °[facility] ° [address] [place], [place] 336-834-9664   °Sharon DeEsch [address] °[place] 336-554-5454   ° °Self-Help/Support Groups °Organization         Address  Phone             Notes  °[facility] - variety of support groups  336- 373-1402 Call for more information  °Narcotics Anonymous (NA), [facility] [address], °[place]  2 meetings at this location  ° °Residential Treatment Programs °Organization         Address  Phone  Notes  °[facility] [address],    °[place]  1-866-801-8205   °[facility] ° [address], [place], [place] 704-293-8524   °[facility] [address], [place] 336-845-3988 Admissions: 8am-3pm M-F  °[facility] [address]    °[place], [place] 336-841-1104   °[facility] [address], [place], [place] 336-379-7146   °[facility] [address]  °[place], [place] 336-285-9073   °[facility] - Intensive Outpatient [address],  [place], [place] 336-852-3033   °[facility] [address]  °[place], [place] 1-877-615-2722 or 336-784-9470   °[facility] [address] [place], [place] 336-227-7417 Accepts Medicaid  °[facility] [address]  °[place] 1-800-659-3381 Substance Abuse/Addiction Treatment  ° °[place] Behavioral Health Resources °Organization         Address  Phone  Notes  °[facility]  (888) 581-9988   °Julie Brannon, PhD [address] [place], [place]   (336) 349-5553 or (336) 951-0000   °[facility]   [address] °[place], [place] (336) 349-4454   °[facility] [address], [place], [place] (336) 342-8316 Insurance/Medicaid/sponsorship through [facility]  °[facility] [address]                                    [place], [place] (336) 342-8316 Therapy/tele-psych/case  °[facility] [address]  ° [place], [place] (336) 349-2233    °Dr. Arfeen  (336) 349-4544   °[facility] [place]  [employer] [place] [facility] 1) [address],  °2) [address], [place] °3)  [address], [place] (336) 349-3220 °(336) 342-7768 ° °(336) 342-8140   °[place] [facility] (336) 342-1394 or (336) 342-3537 (After Hours)    ° ° ° ° °

## 2014-02-09 LAB — CULTURE, GROUP A STREP

## 2014-07-20 ENCOUNTER — Emergency Department (HOSPITAL_COMMUNITY)
Admission: EM | Admit: 2014-07-20 | Discharge: 2014-07-20 | Disposition: A | Discharge status: Home / Self Care | Payer: Self-pay | Attending: Emergency Medicine | Admitting: Emergency Medicine

## 2014-07-20 ENCOUNTER — Encounter (HOSPITAL_COMMUNITY): Payer: Self-pay | Admitting: Emergency Medicine

## 2014-07-20 DIAGNOSIS — H209 Unspecified iridocyclitis: Secondary | ICD-10-CM | POA: Insufficient documentation

## 2014-07-20 DIAGNOSIS — J45909 Unspecified asthma, uncomplicated: Secondary | ICD-10-CM | POA: Insufficient documentation

## 2014-07-20 DIAGNOSIS — Z79899 Other long term (current) drug therapy: Secondary | ICD-10-CM | POA: Insufficient documentation

## 2014-07-20 MED ORDER — FLUORESCEIN SODIUM 1 MG OP STRP
1.0000 | ORAL_STRIP | Freq: Once | OPHTHALMIC | Status: AC
  Administered 2014-07-20: 1 via OPHTHALMIC
  Filled 2014-07-20: qty 1

## 2014-07-20 MED ORDER — TETRACAINE HCL 0.5 % OP SOLN
2.0000 [drp] | Freq: Once | OPHTHALMIC | Status: AC
  Administered 2014-07-20: 2 [drp] via OPHTHALMIC
  Filled 2014-07-20: qty 2

## 2014-07-20 NOTE — ED Notes (Signed)
L/eye red and draining clear secretions. C/o photosensitivity.  Denies URI sx, Denies trauma

## 2014-07-20 NOTE — Discharge Instructions (Signed)
Please follow up with Dr. Vonna Kotyk today for further evaluation

## 2014-07-20 NOTE — ED Provider Notes (Signed)
CSN: [id number]     Arrival date & time 07/20/14  1012 History   First MD Initiated Contact with Patient 07/20/14 1021     Chief Complaint  Patient presents with  . Conjunctivitis    l/eye red     (Consider location/radiation/quality/duration/timing/severity/associated sxs/prior Treatment) HPI Kristin Massey is a 29 y.o. female with hx of asthma, presents to ED with complaint of left eye pain. Pt states pain started 3 days ago. Denies any injuries. States eye is draining clear fluid and is sensitive to the lights. Pt states she can see out of the eye but it is blurred. Denies being exposed to any bright lights or chemicals, but states she did spend all day outside Sunday with no sunglasses. Denies any itching. She does not wear contacts or glasses. No prior eye problems. No tx at home.   Past Medical History  Diagnosis Date  . Asthma    History reviewed. No pertinent past surgical history. Family History  Problem Relation Age of Onset  . Diabetes Other   . Hypertension Other    History  Substance Use Topics  . Smoking status: Never Smoker   . Smokeless tobacco: Not on file  . Alcohol Use: No   OB History    No data available     Review of Systems  Constitutional: Negative for fever and chills.  HENT: Negative for congestion, sinus pressure and sore throat.   Eyes: Positive for photophobia, pain, discharge, redness and visual disturbance. Negative for itching.  Neurological: Positive for headaches.      Allergies  Review of patient's allergies indicates no known allergies.  Home Medications   Prior to Admission medications   Medication Sig Start Date End Date Taking? Authorizing Provider  acetaminophen (TYLENOL) 325 MG tablet Take 1 tablet (325 mg total) by mouth every 6 (six) hours as needed. 08/08/13   Marissa Sciacca, PA-C  albuterol (PROVENTIL HFA;VENTOLIN HFA) 108 (90 BASE) MCG/ACT inhaler Inhale 1-2 puffs into the lungs every 6 (six) hours as needed for wheezing or  shortness of breath.    Historical Provider, MD   BP 107/56 mmHg  Pulse 70  Temp(Src) 98.8 F (37.1 C) (Oral)  Resp 18  Wt 170 lb (77.111 kg)  SpO2 99%  LMP 06/29/2014 (Approximate) Physical Exam  Constitutional: She is oriented to person, place, and time. She appears well-developed and well-nourished. No distress.  HENT:  Head: Normocephalic.  Right Ear: External ear normal.  Left Ear: External ear normal.  Nose: Nose normal.  Mouth/Throat: Oropharynx is clear and moist. No oropharyngeal exudate.  Eyes: EOM and lids are normal. Pupils are equal, round, and reactive to light. Lids are everted and swept, no foreign bodies found. Left conjunctiva is injected.  Slit lamp exam:      The left eye shows no corneal abrasion, no corneal ulcer, no foreign body, no hyphema, no hypopyon and no fluorescein uptake.  Neck: Neck supple.  Cardiovascular: Normal rate, regular rhythm and normal heart sounds.   Pulmonary/Chest: Effort normal and breath sounds normal. No respiratory distress. She has no wheezes. She has no rales.  Neurological: She is alert and oriented to person, place, and time.  Skin: Skin is warm and dry. No rash noted.  Nursing note and vitals reviewed.   ED Course  Procedures (including critical care time) Labs Review Labs Reviewed - No data to display  Imaging Review No results found.   EKG Interpretation None      MDM   Final  diagnoses:  Iritis    Pt with non traumatic left eye pain, consensual photophobia, injected conjunctiva. Concerned about irritis/uvuitis. No corneal abrasions. Visual acuity 20/70 left, 20/30 right. Will contact ophalmology  11:04 AM Spoke with Dr. Vonna Kotyk, ophthalmology, will see her in the office today. No recommended tx in ED. Will dc to Dr. Florence Canner office.   Filed Vitals:   07/20/14 1023 07/20/14 1112  BP: 107/56   Pulse: 70 66  Temp: 98.8 F (37.1 C)   TempSrc: Oral   Resp: 18   Weight: 170 lb (77.111 kg)   SpO2: 99% 100%      Jaynie Crumble, PA-C 07/20/14 1134  Raeford Razor, MD 07/21/14 [phone]

## 2015-09-28 ENCOUNTER — Emergency Department (HOSPITAL_COMMUNITY)
Admission: EM | Admit: 2015-09-28 | Discharge: 2015-09-28 | Disposition: A | Discharge status: Home / Self Care | Payer: Self-pay | Attending: Emergency Medicine | Admitting: Emergency Medicine

## 2015-09-28 ENCOUNTER — Encounter (HOSPITAL_COMMUNITY): Payer: Self-pay | Admitting: Emergency Medicine

## 2015-09-28 DIAGNOSIS — Z79899 Other long term (current) drug therapy: Secondary | ICD-10-CM | POA: Insufficient documentation

## 2015-09-28 DIAGNOSIS — R51 Headache: Secondary | ICD-10-CM | POA: Insufficient documentation

## 2015-09-28 DIAGNOSIS — R42 Dizziness and giddiness: Secondary | ICD-10-CM | POA: Insufficient documentation

## 2015-09-28 DIAGNOSIS — J45909 Unspecified asthma, uncomplicated: Secondary | ICD-10-CM | POA: Insufficient documentation

## 2015-09-28 DIAGNOSIS — R519 Headache, unspecified: Secondary | ICD-10-CM

## 2015-09-28 MED ORDER — BUTALBITAL-APAP-CAFFEINE 50-325-40 MG PO TABS: 1.0000 | ORAL_TABLET | Freq: Three times a day (TID) | ORAL | 0 refills | Status: AC | PRN

## 2015-09-28 MED ORDER — NAPROXEN 500 MG PO TABS
500.0000 mg | ORAL_TABLET | Freq: Once | ORAL | Status: AC
  Administered 2015-09-28: 500 mg via ORAL
  Filled 2015-09-28: qty 1

## 2015-09-28 NOTE — ED Notes (Signed)
Pt comfortably resting and watching television.  States her head feels much better.

## 2015-09-28 NOTE — ED Provider Notes (Signed)
[facility]-EMERGENCY DEPT Provider Note   CSN: [id number] Arrival date & time: 09/28/15  2043 By signing my name below, I, Kristin Massey, attest that this documentation has been prepared under the direction and in the presence of non-physician practitioner, Antony MaduraKelly Angellynn Kimberlin, PA-C Electronically Signed: Levon HedgerElizabeth Massey, Scribe. 09/28/2015. 10:38 PM.   History   Chief Complaint Chief Complaint  Patient presents with  . Headache    HPI Kristin Massey is a 30 y.o. female who presents to the Emergency Department complaining of moderate, intermittent frontal headache which began three days ago. Pt describes her pain as sharp, aching, and throbbing. Pt has not taken any medication for pain. She states her headache is occasionally worsened by loud noises. Pt endorses intermittent lightheadedness. She reports having similar headaches for a few months which typically last a few days. She states her mother has migraines as well. No hx of head injury. Pt denies photophobia, phonophobia, hearing loss, fever, tingling, numbness, nausea, vomiting, difficulty ambulating.   The history is provided by the patient. No language interpreter was used.    Past Medical History:  Diagnosis Date  . Asthma     There are no active problems to display for this patient.   History reviewed. No pertinent surgical history.  OB History    No data available      Home Medications    Prior to Admission medications   Medication Sig Start Date End Date Taking? Authorizing Provider  acetaminophen (TYLENOL) 325 MG tablet Take 1 tablet (325 mg total) by mouth every 6 (six) hours as needed. Patient not taking: Reported on 09/28/2015 08/08/13   Marissa Sciacca, PA-C  albuterol (PROVENTIL HFA;VENTOLIN HFA) 108 (90 BASE) MCG/ACT inhaler Inhale 1-2 puffs into the lungs every 6 (six) hours as needed for wheezing or shortness of breath.    Historical Provider, MD  butalbital-acetaminophen-caffeine (FIORICET) [phone] MG tablet Take 1-2  tablets by mouth every 8 (eight) hours as needed for headache. 09/28/15 09/27/16  Antony MaduraKelly Yutaka Holberg, PA-C    Family History Family History  Problem Relation Age of Onset  . Diabetes Other   . Hypertension Other     Social History Social History  Substance Use Topics  . Smoking status: Never Smoker  . Smokeless tobacco: Not on file  . Alcohol use No     Allergies   Review of patient's allergies indicates no known allergies.  Review of Systems Review of Systems  HENT: Negative for hearing loss.   Eyes: Negative for photophobia.  Gastrointestinal: Negative for nausea and vomiting.  Musculoskeletal: Negative for gait problem.  Neurological: Positive for light-headedness and headaches. Negative for weakness and numbness.  Ten systems reviewed and are negative for acute change, except as noted in the HPI.     Physical Exam Updated Vital Signs BP 110/57 (BP Location: Left Arm)   Pulse 61   Temp 98.2 F (36.8 C) (Oral)   Resp 18   Ht 5\' 2"  (1.575 m)   Wt 78 kg   LMP 09/21/2015 (Approximate)   SpO2 100%   BMI 31.46 kg/m   Physical Exam  Constitutional: She is oriented to person, place, and time. She appears well-developed and well-nourished. No distress.  HENT:  Head: Normocephalic and atraumatic.  Mouth/Throat: Oropharynx is clear and moist.  Symmetric rise of the uvula with phonation  Eyes: Conjunctivae and EOM are normal. Pupils are equal, round, and reactive to light. No scleral icterus.  Neck: Normal range of motion.  No nuchal rigidity or meningismus  Cardiovascular:  Normal rate, regular rhythm and intact distal pulses.   Pulmonary/Chest: Effort normal. No respiratory distress.  Respirations even and unlabored  Musculoskeletal: Normal range of motion.  Neurological: She is alert and oriented to person, place, and time. No cranial nerve deficit. She exhibits normal muscle tone. Coordination normal.  GCS 15. Speech is goal oriented. No focal neurologic deficits  appreciated. Patient moving all extremities. Normal finger-nose-finger. Patient ambulatory with steady gait.  Skin: Skin is warm and dry. No rash noted. She is not diaphoretic. No erythema. No pallor.  Psychiatric: She has a normal mood and affect. Her behavior is normal.  Nursing note and vitals reviewed.    ED Treatments / Results  DIAGNOSTIC STUDIES:  Oxygen Saturation is 100% on RA, normal by my interpretation.    COORDINATION OF CARE:  10:35 PM Discussed treatment plan which includes naproxen with pt at bedside and pt agreed to plan.   Labs (all labs ordered are listed, but only abnormal results are displayed) Labs Reviewed - No data to display  EKG  EKG Interpretation None       Radiology No results found.  Procedures Procedures (including critical care time)  Medications Ordered in ED Medications  naproxen (NAPROSYN) tablet 500 mg (500 mg Oral Given 09/28/15 2242)     Initial Impression / Assessment and Plan / ED Course  I have reviewed the triage vital signs and the nursing notes.  Pertinent labs & imaging results that were available during my care of the patient were reviewed by me and considered in my medical decision making (see chart for details).  Clinical Course    30 year old female presents to the emergency department for evaluation of headache. She reports similar headaches over the past few months. Headache today has been present for 3 days. Patient is afebrile and without nuchal rigidity or meningismus. She has a nonfocal neurologic exam. No history of head injury or trauma. Pain has started to improve following naproxen. The patient feels comfortable managing her symptoms further on an outpatient basis. Will refer to neurology. Return precautions discussed and provided. Patient discharged in satisfactory condition with no unaddressed concerns.   Final Clinical Impressions(s) / ED Diagnoses   Final diagnoses:  Headache, unspecified headache type      I personally performed the services described in this documentation, which was scribed in my presence. The recorded information has been reviewed and is accurate.    New Prescriptions Discharge Medication List as of 09/28/2015 11:25 PM    START taking these medications   Details  butalbital-acetaminophen-caffeine (FIORICET) 50-325-40 MG tablet Take 1-2 tablets by mouth every 8 (eight) hours as needed for headache., Starting Wed 09/28/2015, Until Thu 09/27/2016, Print         [place], PA-C 09/29/15 0006    Benjiman Core, MD 09/29/15 [phone]

## 2015-09-28 NOTE — ED Notes (Signed)
Pt ambulatory and independent at discharge.  Verbalized understanding of discharge instructions 

## 2015-09-28 NOTE — ED Triage Notes (Signed)
Pt states that she has had a headache x 3 days. Neuro intact. Has not tried any OTC meds. Alert and oriented.

## 2016-04-13 ENCOUNTER — Emergency Department (HOSPITAL_COMMUNITY)
Admission: EM | Admit: 2016-04-13 | Discharge: 2016-04-13 | Disposition: A | Discharge status: Home / Self Care | Payer: Self-pay | Attending: Emergency Medicine | Admitting: Emergency Medicine

## 2016-04-13 ENCOUNTER — Emergency Department (HOSPITAL_COMMUNITY): Payer: Self-pay

## 2016-04-13 ENCOUNTER — Encounter (HOSPITAL_COMMUNITY): Payer: Self-pay

## 2016-04-13 DIAGNOSIS — K047 Periapical abscess without sinus: Secondary | ICD-10-CM | POA: Insufficient documentation

## 2016-04-13 DIAGNOSIS — R059 Cough, unspecified: Secondary | ICD-10-CM

## 2016-04-13 DIAGNOSIS — R05 Cough: Secondary | ICD-10-CM | POA: Insufficient documentation

## 2016-04-13 DIAGNOSIS — J45909 Unspecified asthma, uncomplicated: Secondary | ICD-10-CM | POA: Insufficient documentation

## 2016-04-13 MED ORDER — PENICILLIN V POTASSIUM 500 MG PO TABS
500.0000 mg | ORAL_TABLET | Freq: Four times a day (QID) | ORAL | 0 refills | Status: AC
Start: 2016-04-13 — End: 2016-04-20

## 2016-04-13 MED ORDER — FLUTICASONE PROPIONATE 50 MCG/ACT NA SUSP: 2.0000 | Freq: Every day | NASAL | 0 refills | Status: DC

## 2016-04-13 MED ORDER — LORATADINE 10 MG PO TABS: 10.0000 mg | ORAL_TABLET | Freq: Every day | ORAL | 0 refills | Status: DC

## 2016-04-13 NOTE — Discharge Instructions (Signed)
Your x-ray was negative today.  Your dry cough and nasal congestion is possibly due to seasonal allergies.  Patient with asthma tend to have allergies as well.  Please use your albuterol as needed. You have been prescribed loratadine and flonase for your symptoms.  You should take loratadine or any other allergy medication daily.   You have an infection in two of your teeth.  Please take the prescribed antibiotics and CALL DENTIST TO MAKE AN APPOINTMENT FOR AN EVALUATION AND TREATMENT AS SOON AS POSSIBLE.  An untreated dental infection is dangerous as it can quickly spread to surrounding tissues. Return to the emergency room if you notice increased swelling, pain, yellow discharge or if you develop a fever, facial swelling or difficulty opening/closing your jaw.

## 2016-04-13 NOTE — ED Triage Notes (Signed)
Pt with cough x 2 weeks.  No fever.  Minimal sputum.  Dental pain x months.

## 2016-04-13 NOTE — ED Provider Notes (Signed)
[facility]-EMERGENCY DEPT Provider Note CSN: [id number] Arrival date & time: 04/13/16  1650  By signing my name below, I, Sonum Patel, attest that this documentation has been prepared under the direction and in the presence of Sharen Heck, PA-C. Electronically Signed: Leone Payor, Scribe. 04/13/16. 6:23 PM.  History   Chief Complaint Chief Complaint  Patient presents with  . Dental Pain  . Cough    The history is provided by the patient. No language interpreter was used.     HPI Comments: Kristin Massey is a 31 y.o. female who presents to the Emergency Department with two complaints. She complains of persistent right upper dental pain that began >2 months ago but has been worsening the last 2-3 days. She denies associated gum bleeding and is unsure if the tooth is cracked. She denies having a history of DM and is a non-smoker.   She also complains of a dry cough for the past 2 weeks associated with nasal congestion and clear rhinorrhea.  Patient has a h/o of asthma, has albuterol inhaler at home that she does not use very often.  No fever, chest tightness, SOB, nausea, vomiting, diarrhea. She denies known seasonal allergies, does not take allergy medications at home.   Past Medical History:  Diagnosis Date  . Asthma     There are no active problems to display for this patient.   History reviewed. No pertinent surgical history.  OB History    No data available       Home Medications    Prior to Admission medications   Medication Sig Start Date End Date Taking? Authorizing Provider  acetaminophen (TYLENOL) 325 MG tablet Take 1 tablet (325 mg total) by mouth every 6 (six) hours as needed. Patient not taking: Reported on 09/28/2015 08/08/13   Marissa Sciacca, PA-C  albuterol (PROVENTIL HFA;VENTOLIN HFA) 108 (90 BASE) MCG/ACT inhaler Inhale 1-2 puffs into the lungs every 6 (six) hours as needed for wheezing or shortness of breath.    Historical Provider, MD    butalbital-acetaminophen-caffeine (FIORICET) [phone] MG tablet Take 1-2 tablets by mouth every 8 (eight) hours as needed for headache. 09/28/15 09/27/16  Antony Madura, PA-C  fluticasone (FLONASE) 50 MCG/ACT nasal spray Place 2 sprays into both nostrils daily. FOR NASAL CONGESTION 04/13/16   Liberty Handy, PA-C  loratadine (CLARITIN) 10 MG tablet Take 1 tablet (10 mg total) by mouth daily. 04/13/16   Liberty Handy, PA-C  penicillin v potassium (VEETID) 500 MG tablet Take 1 tablet (500 mg total) by mouth 4 (four) times daily. 04/13/16 04/20/16  Liberty Handy, PA-C    Family History Family History  Problem Relation Age of Onset  . Diabetes Other   . Hypertension Other     Social History Social History  Substance Use Topics  . Smoking status: Never Smoker  . Smokeless tobacco: Never Used  . Alcohol use No     Allergies   Patient has no known allergies.   Review of Systems Review of Systems  Constitutional: Negative for fever.  HENT: Positive for dental problem. Negative for rhinorrhea.   Respiratory: Positive for cough. Negative for shortness of breath.   Gastrointestinal: Negative for abdominal pain, diarrhea, nausea and vomiting.     Physical Exam Updated Vital Signs BP 106/62 (BP Location: Left Arm)   Pulse 65   Temp 98.9 F (37.2 C) (Oral)   Resp 14   LMP 04/08/2016   SpO2 100%   Physical Exam  Constitutional: She is oriented to  person, place, and time. She appears well-developed and well-nourished. No distress.  HENT:  Head: Normocephalic and atraumatic.  Right Ear: External ear normal.  Left Ear: External ear normal.  Nose: Mucosal edema (moderate) and rhinorrhea present. Right sinus exhibits no maxillary sinus tenderness and no frontal sinus tenderness. Left sinus exhibits no maxillary sinus tenderness and no frontal sinus tenderness.  Mouth/Throat: Oropharynx is clear and moist. Dental abscesses present. No oropharyngeal exudate.    Teeth #3 and #4  with mild gingival margin erythema with mild tenderness, bleeding and scant purulent discharge upon applied pressure, no surrounding signs of edema, erythema or obvious signs of abscess to surrounding gum line. Poor dentition.  No facial or anterior neck edema, erythema or erythema. No sublingual edema or tenderness.  Soft palate flat without tenderness.  No trismus.   No pooling of oral secretions.  Phonation normal, no hot potato voice.  Maxilla and mandible nontender. Mastoids without edema, erythema or tenderness.   Eyes: Conjunctivae and EOM are normal. Pupils are equal, round, and reactive to light. No scleral icterus.  Neck: Normal range of motion. Neck supple. No JVD present.  Cardiovascular: Normal rate, regular rhythm and normal heart sounds.   No murmur heard. Pulmonary/Chest: Effort normal and breath sounds normal. She has no wheezes.  Abdominal: Soft. There is no tenderness.  Musculoskeletal: Normal range of motion. She exhibits no deformity.  Lymphadenopathy:    She has no cervical adenopathy.  Neurological: She is alert and oriented to person, place, and time.  Skin: Skin is warm and dry. Capillary refill takes less than 2 seconds.  Psychiatric: She has a normal mood and affect. Her behavior is normal. Judgment and thought content normal.  Nursing note and vitals reviewed.    ED Treatments / Results  DIAGNOSTIC STUDIES: Oxygen Saturation is 100% on RA, normal by my interpretation.    COORDINATION OF CARE: 6:25 PM Discussed treatment plan with pt at bedside and pt agreed to plan.   Labs (all labs ordered are listed, but only abnormal results are displayed) Labs Reviewed - No data to display  EKG  EKG Interpretation None       Radiology Dg Chest 2 View  Result Date: 04/13/2016 CLINICAL DATA:  Cough for 2 weeks.  Dental pain. EXAM: CHEST  2 VIEW COMPARISON:  None. FINDINGS: No visualized gas in the neck or mediastinum. The lungs appear clear. Cardiac and  mediastinal contours normal. No pleural effusion identified. IMPRESSION: 1.  No significant abnormality identified. Electronically Signed   By: Gaylyn RongWalter  Liebkemann M.D.   On: 04/13/2016 18:14    Procedures Procedures (including critical care time)  Medications Ordered in ED Medications - No data to display   Initial Impression / Assessment and Plan / ED Course  I have reviewed the triage vital signs and the nursing notes.  Pertinent labs & imaging results that were available during my care of the patient were reviewed by me and considered in my medical decision making (see chart for details).     Patient with dentalgia.  Physical exam findings suggestive of early stage dental abscess, purulence was drained.  Exam not concerning for Ludwig's angina or pharyngeal abscess.  Will treat with abx for dental abscess. Pt instructed to follow-up with dentist as soon as possible for re-evaluation and complete treatment.   Cough likely allergic in nature given h/o asthma and associated nasal congestion and clear rhinorrhea.  Exam reassuring, vital signs within normal limits, CXR ordered at triage w/o signs of  consolidation.  Doubt URI infection, asthma exacerbation at thist ime. Will discharge patient with flonase, loratadine for symptoms.  Discussed return precautions. Pt safe for discharge.    Final Clinical Impressions(s) / ED Diagnoses   Final diagnoses:  Cough  Dental abscess    New Prescriptions Discharge Medication List as of 04/13/2016  6:35 PM    START taking these medications   Details  fluticasone (FLONASE) 50 MCG/ACT nasal spray Place 2 sprays into both nostrils daily. FOR NASAL CONGESTION, Starting Fri 04/13/2016, Print    loratadine (CLARITIN) 10 MG tablet Take 1 tablet (10 mg total) by mouth daily., Starting Fri 04/13/2016, Print    penicillin v potassium (VEETID) 500 MG tablet Take 1 tablet (500 mg total) by mouth 4 (four) times daily., Starting Fri 04/13/2016, Until Fri  04/20/2016, Print       I personally performed the services described in this documentation, which was scribed in my presence. The recorded information has been reviewed and is accurate.    Liberty Handy, PA-C 04/15/16 1122    Melene Plan, DO 04/15/16 2324

## 2017-08-05 ENCOUNTER — Emergency Department (HOSPITAL_COMMUNITY)
Admission: EM | Admit: 2017-08-05 | Discharge: 2017-08-05 | Disposition: A | Discharge status: Home / Self Care | Payer: Self-pay | Attending: Emergency Medicine | Admitting: Emergency Medicine

## 2017-08-05 ENCOUNTER — Encounter (HOSPITAL_COMMUNITY): Payer: Self-pay | Admitting: *Deleted

## 2017-08-05 ENCOUNTER — Other Ambulatory Visit: Payer: Self-pay

## 2017-08-05 DIAGNOSIS — J45909 Unspecified asthma, uncomplicated: Secondary | ICD-10-CM | POA: Insufficient documentation

## 2017-08-05 DIAGNOSIS — Z79899 Other long term (current) drug therapy: Secondary | ICD-10-CM | POA: Insufficient documentation

## 2017-08-05 DIAGNOSIS — K0889 Other specified disorders of teeth and supporting structures: Secondary | ICD-10-CM | POA: Insufficient documentation

## 2017-08-05 MED ORDER — LIDOCAINE VISCOUS HCL 2 % MT SOLN: 15.0000 mL | OROMUCOSAL | 0 refills | Status: DC | PRN

## 2017-08-05 MED ORDER — PENICILLIN V POTASSIUM 500 MG PO TABS: 500.0000 mg | ORAL_TABLET | Freq: Four times a day (QID) | ORAL | 0 refills | Status: AC

## 2017-08-05 NOTE — ED Notes (Signed)
ED Provider at bedside. 

## 2017-08-05 NOTE — ED Notes (Signed)
Bed: WTR7 Expected date:  Expected time:  Means of arrival:  Comments: 

## 2017-08-05 NOTE — ED Triage Notes (Signed)
Pt reports 2-3 days of upper right dental pain, denies fevers, has been taking ibuprofen for relief.

## 2017-08-05 NOTE — ED Provider Notes (Signed)
[facility]-EMERGENCY DEPT Provider Note   CSN: [id number] Arrival date & time: 08/05/17  0229     [id number]   Chief Complaint Chief Complaint  Patient presents with  . Dental Pain    HPI Kristin Massey is a 32 y.o. female.  HPI 32 year old AA female with no pertinent past medical history presents to the ED for evaluation of dental pain.  Patient reports dental pain for the past 2 to 3 days.  States the pain is in her upper right tooth.  Patient denies associated fevers, chills, difficulty breathing or swallowing.  She is to take ibuprofen with some relief of her symptoms.  Patient does not have a dentist.  Denies any other associated symptoms. Past Medical History:  Diagnosis Date  . Asthma     There are no active problems to display for this patient.   History reviewed. No pertinent surgical history.   OB History   None      Home Medications    Prior to Admission medications   Medication Sig Start Date End Date Taking? Authorizing Provider  acetaminophen (TYLENOL) 325 MG tablet Take 1 tablet (325 mg total) by mouth every 6 (six) hours as needed. Patient not taking: Reported on 09/28/2015 08/08/13   Sciacca, Marissa, PA-C  albuterol (PROVENTIL HFA;VENTOLIN HFA) 108 (90 BASE) MCG/ACT inhaler Inhale 1-2 puffs into the lungs every 6 (six) hours as needed for wheezing or shortness of breath.    [provider]  fluticasone (FLONASE) 50 MCG/ACT nasal spray Place 2 sprays into both nostrils daily. FOR NASAL CONGESTION 04/13/16   Liberty HandyGibbons, Claudia J, PA-C  lidocaine (XYLOCAINE) 2 % solution Use as directed 15 mLs in the mouth or throat as needed for mouth pain. 08/05/17   Rise MuLeaphart, Mccade Sullenberger T, PA-C  loratadine (CLARITIN) 10 MG tablet Take 1 tablet (10 mg total) by mouth daily. 04/13/16   Liberty HandyGibbons, Claudia J, PA-C  penicillin v potassium (VEETID) 500 MG tablet Take 1 tablet (500 mg total) by mouth 4 (four) times daily for 7 days. 08/05/17 08/12/17  Rise MuLeaphart, Dagon Budai  T, PA-C    Family History Family History  Problem Relation Age of Onset  . Diabetes Other   . Hypertension Other     Social History Social History   Tobacco Use  . Smoking status: Never Smoker  . Smokeless tobacco: Never Used  Substance Use Topics  . Alcohol use: No  . Drug use: No     Allergies   Patient has no known allergies.   Review of Systems Review of Systems  Constitutional: Negative for fever.  HENT: Positive for dental problem. Negative for trouble swallowing.   Respiratory: Negative for shortness of breath.   Gastrointestinal: Negative for vomiting.  Neurological: Negative for headaches.     Physical Exam Updated Vital Signs BP (!) 124/91 (BP Location: Left Arm)   Pulse 61   Temp 98.4 F (36.9 C) (Oral)   Resp 16   LMP 07/31/2017   SpO2 100%   Physical Exam  Constitutional: She appears well-developed and well-nourished. No distress.  HENT:  Head: Normocephalic and atraumatic.  Mouth/Throat: Uvula is midline and oropharynx is clear and moist.    Tender to palpation with several dental caries noted.  No gross abscess.  Mild gingival edema without any erythema or purulent drainage.  No sublingual or submandibular swelling.  Oropharynx is clear.  Managing secretions tolerating airway.  Uvula is midline.  No facial swelling noted.  Eyes: Right eye exhibits no discharge.  Left eye exhibits no discharge. No scleral icterus.  Neck: Normal range of motion. Neck supple.  Pulmonary/Chest: No respiratory distress.  Musculoskeletal: Normal range of motion.  Lymphadenopathy:    She has no cervical adenopathy.  Neurological: She is alert.  Skin: No pallor.  Psychiatric: Her behavior is normal. Judgment and thought content normal.  Nursing note and vitals reviewed.    ED Treatments / Results  Labs (all labs ordered are listed, but only abnormal results are displayed) Labs Reviewed - No data to display  EKG None  Radiology No results  found.  Procedures Procedures (including critical care time)  Medications Ordered in ED Medications - No data to display   Initial Impression / Assessment and Plan / ED Course  I have reviewed the triage vital signs and the nursing notes.  Pertinent labs & imaging results that were available during my care of the patient were reviewed by me and considered in my medical decision making (see chart for details).     Patient with toothache.  No gross abscess.  Exam unconcerning for Ludwig's angina or spread of infection.  Will treat with penicillin and pain medicine.  Urged patient to follow-up with dentist.     Final Clinical Impressions(s) / ED Diagnoses   Final diagnoses:  Pain, dental    ED Discharge Orders        Ordered    penicillin v potassium (VEETID) 500 MG tablet  4 times daily     08/05/17 0436    lidocaine (XYLOCAINE) 2 % solution  As needed     08/05/17 0436       Rise Mu, PA-C 08/05/17 0705    Melene Plan, DO 08/07/17 1202

## 2017-08-05 NOTE — Discharge Instructions (Signed)

## 2017-10-14 ENCOUNTER — Emergency Department (HOSPITAL_COMMUNITY)
Admission: EM | Admit: 2017-10-14 | Discharge: 2017-10-15 | Disposition: A | Discharge status: Home / Self Care | Payer: Self-pay | Attending: Emergency Medicine | Admitting: Emergency Medicine

## 2017-10-14 ENCOUNTER — Encounter (HOSPITAL_COMMUNITY): Payer: Self-pay | Admitting: Emergency Medicine

## 2017-10-14 ENCOUNTER — Emergency Department (HOSPITAL_COMMUNITY): Payer: Self-pay

## 2017-10-14 ENCOUNTER — Other Ambulatory Visit: Payer: Self-pay

## 2017-10-14 DIAGNOSIS — Y929 Unspecified place or not applicable: Secondary | ICD-10-CM | POA: Insufficient documentation

## 2017-10-14 DIAGNOSIS — S53402A Unspecified sprain of left elbow, initial encounter: Secondary | ICD-10-CM | POA: Insufficient documentation

## 2017-10-14 DIAGNOSIS — S63502A Unspecified sprain of left wrist, initial encounter: Secondary | ICD-10-CM | POA: Insufficient documentation

## 2017-10-14 DIAGNOSIS — Y939 Activity, unspecified: Secondary | ICD-10-CM | POA: Insufficient documentation

## 2017-10-14 DIAGNOSIS — Y999 Unspecified external cause status: Secondary | ICD-10-CM | POA: Insufficient documentation

## 2017-10-14 DIAGNOSIS — Z79899 Other long term (current) drug therapy: Secondary | ICD-10-CM | POA: Insufficient documentation

## 2017-10-14 DIAGNOSIS — W010XXA Fall on same level from slipping, tripping and stumbling without subsequent striking against object, initial encounter: Secondary | ICD-10-CM | POA: Insufficient documentation

## 2017-10-14 DIAGNOSIS — J45909 Unspecified asthma, uncomplicated: Secondary | ICD-10-CM | POA: Insufficient documentation

## 2017-10-14 NOTE — ED Triage Notes (Signed)
Pt reports falling on Friday and landing on left arm now unable to straighten arm and has intermittent pain from fingers to shoulder.

## 2017-10-15 MED ORDER — IBUPROFEN 800 MG PO TABS: 800.0000 mg | ORAL_TABLET | Freq: Once | ORAL | Status: DC

## 2017-10-15 NOTE — Discharge Instructions (Signed)
You may alternate Tylenol 1000 mg every 6 hours as needed for pain and Ibuprofen 800 mg every 8 hours as needed for pain.  Please take Ibuprofen with food. ° °

## 2017-10-15 NOTE — ED Provider Notes (Signed)
TIME SEEN: 12:32 AM  CHIEF COMPLAINT: Left arm injury  HPI: Patient is a right-hand-dominant 32 year old female with history of asthma who presents to the emergency department after she had a mechanical fall.  States she tripped and landed with her arms outstretched in front of her.  Complaining of pain from the left wrist up to the left shoulder.  No deformity.  No other injury.  No head injury.  No neck or back pain.  ROS: See HPI Constitutional: no fever  Eyes: no drainage  ENT: no runny nose   Cardiovascular:  no chest pain  Resp: no SOB  GI: no vomiting GU: no dysuria Integumentary: no rash  Allergy: no hives  Musculoskeletal: no leg swelling  Neurological: no slurred speech ROS otherwise negative  PAST MEDICAL HISTORY/PAST SURGICAL HISTORY:  Past Medical History:  Diagnosis Date  . Asthma     MEDICATIONS:  Prior to Admission medications   Medication Sig Start Date End Date Taking? Authorizing Provider  acetaminophen (TYLENOL) 325 MG tablet Take 1 tablet (325 mg total) by mouth every 6 (six) hours as needed. Patient not taking: Reported on 09/28/2015 08/08/13   Sciacca, Marissa, PA-C  albuterol (PROVENTIL HFA;VENTOLIN HFA) 108 (90 BASE) MCG/ACT inhaler Inhale 1-2 puffs into the lungs every 6 (six) hours as needed for wheezing or shortness of breath.    [provider]  fluticasone (FLONASE) 50 MCG/ACT nasal spray Place 2 sprays into both nostrils daily. FOR NASAL CONGESTION 04/13/16   Liberty Handy, PA-C  lidocaine (XYLOCAINE) 2 % solution Use as directed 15 mLs in the mouth or throat as needed for mouth pain. 08/05/17   Rise Mu, PA-C  loratadine (CLARITIN) 10 MG tablet Take 1 tablet (10 mg total) by mouth daily. 04/13/16   Liberty Handy, PA-C    ALLERGIES:  No Known Allergies  SOCIAL HISTORY:  Social History   Tobacco Use  . Smoking status: Never Smoker  . Smokeless tobacco: Never Used  Substance Use Topics  . Alcohol use: No    FAMILY  HISTORY: Family History  Problem Relation Age of Onset  . Diabetes Other   . Hypertension Other     EXAM: BP 104/75   Pulse 66   Temp 98 F (36.7 C) (Oral)   Resp 18   Ht 5\' 2"  (1.575 m)   Wt 80.5 kg   LMP 09/27/2017   SpO2 100%   BMI 32.45 kg/m  CONSTITUTIONAL: Alert and oriented and responds appropriately to questions. Well-appearing; well-nourished; GCS 15 HEAD: Normocephalic; atraumatic EYES: Conjunctivae clear, PERRL, EOMI ENT: normal nose; no rhinorrhea; moist mucous membranes; pharynx without lesions noted; no dental injury; no septal hematoma NECK: Supple, no meningismus, no LAD; no midline spinal tenderness, step-off or deformity; trachea midline CARD: RRR; S1 and S2 appreciated; no murmurs, no clicks, no rubs, no gallops RESP: Normal chest excursion without splinting or tachypnea; breath sounds clear and equal bilaterally; no wheezes, no rhonchi, no rales; no hypoxia or respiratory distress CHEST:  chest wall stable, no crepitus or ecchymosis or deformity, nontender to palpation; no flail chest ABD/GI: Normal bowel sounds; non-distended; soft, non-tender, no rebound, no guarding; no ecchymosis or other lesions noted PELVIS:  stable, nontender to palpation BACK:  The back appears normal and is non-tender to palpation, there is no CVA tenderness; no midline spinal tenderness, step-off or deformity EXT: Normal ROM in all joints; mostly tender over the left forearm and elbow without swelling.  Otherwise extremities are non-tender to palpation; no  edema; normal capillary refill; no cyanosis,no bony deformity of patient's extremities, no joint effusion, compartments are soft, extremities are warm and well-perfused, no ecchymosis, normal strength in bilateral hands, she has pain with supination of the left arm and full extension of the elbow but does have full range of motion.  She has 2+ radial pulses bilaterally. SKIN: Normal color for age and race; warm NEURO: Moves all  extremities equally PSYCH: The patient's mood and manner are appropriate. Grooming and personal hygiene are appropriate.  MEDICAL DECISION MAKING: Patient here with mechanical fall.  X-ray of the left arm show no acute abnormality.  Suspect contusion, sprain.  Recommended alternating Tylenol Motrin for pain.  Will provide with work note.  Recommended rest, elevation, ice.  No other injury today.  At this time, I do not feel there is any life-threatening condition present. I have reviewed and discussed all results (EKG, imaging, lab, urine as appropriate) and exam findings with patient/family. I have reviewed nursing notes and appropriate previous records.  I feel the patient is safe to be discharged home without further emergent workup and can continue workup as an outpatient as needed. Discussed usual and customary return precautions. Patient/family verbalize understanding and are comfortable with this plan.  Outpatient follow-up has been provided if needed. All questions have been answered.      Gerrianne Aydelott, Layla MawKristen N, DO 10/15/17 [phone]

## 2018-02-03 ENCOUNTER — Other Ambulatory Visit: Payer: Self-pay

## 2018-02-03 ENCOUNTER — Emergency Department (HOSPITAL_COMMUNITY)
Admission: EM | Admit: 2018-02-03 | Discharge: 2018-02-03 | Disposition: A | Discharge status: Home / Self Care | Payer: Self-pay | Attending: Emergency Medicine | Admitting: Emergency Medicine

## 2018-02-03 ENCOUNTER — Emergency Department (HOSPITAL_COMMUNITY): Payer: Self-pay

## 2018-02-03 ENCOUNTER — Encounter (HOSPITAL_COMMUNITY): Payer: Self-pay

## 2018-02-03 DIAGNOSIS — R059 Cough, unspecified: Secondary | ICD-10-CM

## 2018-02-03 DIAGNOSIS — M62830 Muscle spasm of back: Secondary | ICD-10-CM | POA: Insufficient documentation

## 2018-02-03 DIAGNOSIS — Z79899 Other long term (current) drug therapy: Secondary | ICD-10-CM | POA: Insufficient documentation

## 2018-02-03 DIAGNOSIS — R05 Cough: Secondary | ICD-10-CM | POA: Insufficient documentation

## 2018-02-03 DIAGNOSIS — J45909 Unspecified asthma, uncomplicated: Secondary | ICD-10-CM | POA: Insufficient documentation

## 2018-02-03 MED ORDER — PREDNISONE 10 MG PO TABS: 40.0000 mg | ORAL_TABLET | Freq: Every day | ORAL | 0 refills | Status: DC

## 2018-02-03 MED ORDER — GUAIFENESIN 100 MG/5ML PO SYRP
100.0000 mg | ORAL_SOLUTION | ORAL | 0 refills | Status: DC | PRN
Start: 2018-02-03 — End: 2020-04-25

## 2018-02-03 MED ORDER — CYCLOBENZAPRINE HCL 10 MG PO TABS: 10.0000 mg | ORAL_TABLET | Freq: Two times a day (BID) | ORAL | 0 refills | Status: DC | PRN

## 2018-02-03 NOTE — ED Provider Notes (Signed)
[facility]-EMERGENCY DEPT Provider Note   CSN: [id number] Arrival date & time: 02/03/18  [id number]     History   Chief Complaint Chief Complaint  Patient presents with  . Back Pain  . Cough    HPI Kristin Massey is a 33 y.o. female who presents to the ED with c/o back pain and cough. Patient reports the back pain started today and is located in the right lower back. The pain increases with movement. Patient also c/o cough x 2 months. She does have a hx of asthma but this does not feel like asthma. Patient reports the cough is productive with yellow sputum.   The history is provided by the patient. No language interpreter was used.  Back Pain  Location:  Lumbar spine Quality:  Shooting Radiates to:  Does not radiate Pain severity:  Moderate Onset quality:  Gradual Duration:  1 day Timing:  Constant Progression:  Worsening Relieved by:  Nothing Worsened by:  Movement Ineffective treatments:  None tried Associated symptoms: no abdominal pain, no bladder incontinence, no bowel incontinence, no dysuria, no fever, no headaches and no leg pain   Cough  Associated symptoms: no chills, no fever, no headaches and no rash     Past Medical History:  Diagnosis Date  . Asthma     There are no active problems to display for this patient.   History reviewed. No pertinent surgical history.   OB History   No obstetric history on file.      Home Medications    Prior to Admission medications   Medication Sig Start Date End Date Taking? Authorizing Provider  acetaminophen (TYLENOL) 325 MG tablet Take 1 tablet (325 mg total) by mouth every 6 (six) hours as needed. Patient not taking: Reported on 09/28/2015 08/08/13   Sciacca, Marissa, PA-C  albuterol (PROVENTIL HFA;VENTOLIN HFA) 108 (90 BASE) MCG/ACT inhaler Inhale 1-2 puffs into the lungs every 6 (six) hours as needed for wheezing or shortness of breath.    [provider]  cyclobenzaprine (FLEXERIL) 10 MG  tablet Take 1 tablet (10 mg total) by mouth 2 (two) times daily as needed for muscle spasms. 02/03/18   Janne NapoleonNeese, Sloane Palmer M, NP  fluticasone (FLONASE) 50 MCG/ACT nasal spray Place 2 sprays into both nostrils daily. FOR NASAL CONGESTION 04/13/16   Liberty HandyGibbons, Claudia J, PA-C  guaifenesin (ROBITUSSIN) 100 MG/5ML syrup Take 5-10 mLs (100-200 mg total) by mouth every 4 (four) hours as needed for cough. 02/03/18   Janne NapoleonNeese, Jochebed Bills M, NP  lidocaine (XYLOCAINE) 2 % solution Use as directed 15 mLs in the mouth or throat as needed for mouth pain. 08/05/17   Rise MuLeaphart, Kenneth T, PA-C  loratadine (CLARITIN) 10 MG tablet Take 1 tablet (10 mg total) by mouth daily. 04/13/16   Liberty HandyGibbons, Claudia J, PA-C  predniSONE (DELTASONE) 10 MG tablet Take 4 tablets (40 mg total) by mouth daily with breakfast. 02/03/18   Janne NapoleonNeese, Tesneem Dufrane M, NP    Family History Family History  Problem Relation Age of Onset  . Cancer Mother   . Diabetes Other   . Hypertension Other     Social History Social History   Tobacco Use  . Smoking status: Never Smoker  . Smokeless tobacco: Never Used  Substance Use Topics  . Alcohol use: No  . Drug use: No     Allergies   Patient has no known allergies.   Review of Systems Review of Systems  Constitutional: Negative for chills and fever.  HENT: Negative.  Respiratory: Positive for cough.   Gastrointestinal: Negative for abdominal pain, bowel incontinence, nausea and vomiting.  Genitourinary: Negative for bladder incontinence, dysuria, frequency and urgency.  Musculoskeletal: Positive for back pain.  Skin: Negative for rash.  Neurological: Negative for headaches.  Psychiatric/Behavioral: The patient is not nervous/anxious.      Physical Exam Updated Vital Signs BP (!) 94/57 (BP Location: Right Arm)   Pulse 77   Temp 99.4 F (37.4 C) (Oral)   Resp 14   Ht 5\' 2"  (1.575 m)   Wt 80.3 kg   LMP 01/17/2018   SpO2 100%   BMI 32.37 kg/m   Physical Exam Vitals signs and nursing note reviewed.    Constitutional:      General: She is not in acute distress.    Appearance: She is well-developed.  HENT:     Head: Normocephalic.     Nose: Congestion present.     Mouth/Throat:     Mouth: Mucous membranes are moist.     Pharynx: Oropharynx is clear.  Eyes:     Conjunctiva/sclera: Conjunctivae normal.  Neck:     Musculoskeletal: Neck supple.  Cardiovascular:     Rate and Rhythm: Normal rate and regular rhythm.  Pulmonary:     Effort: Pulmonary effort is normal. No respiratory distress.     Breath sounds: No wheezing, rhonchi or rales.  Chest:     Chest wall: No tenderness.  Abdominal:     Palpations: Abdomen is soft.     Tenderness: There is no abdominal tenderness.  Musculoskeletal:     Lumbar back: She exhibits tenderness and spasm. She exhibits normal pulse. Decreased range of motion: due to pain.       Back:     Comments: Grips are equal, radial pulses 2+, adequate circulation. Pain with palpation of the right lower back with muscle spasm. Increased pain with bending forward.   Skin:    General: Skin is warm and dry.  Neurological:     Mental Status: She is alert and oriented to person, place, and time.     Cranial Nerves: No cranial nerve deficit.     Sensory: Sensation is intact.     Motor: Motor function is intact.     Gait: Gait normal.     Deep Tendon Reflexes:     Reflex Scores:      Bicep reflexes are 2+ on the right side and 2+ on the left side.      Brachioradialis reflexes are 2+ on the right side and 2+ on the left side.      Patellar reflexes are 2+ on the right side and 2+ on the left side. Psychiatric:        Mood and Affect: Mood normal.      ED Treatments / Results  Labs (all labs ordered are listed, but only abnormal results are displayed) Labs Reviewed - No data to display  Radiology Dg Chest 1 View  Result Date: 02/03/2018 CLINICAL DATA:  Cough EXAM: CHEST  1 VIEW COMPARISON:  04/13/2016 FINDINGS: The heart size and mediastinal contours  are within normal limits. Both lungs are clear. The visualized skeletal structures are unremarkable. IMPRESSION: No active disease. Electronically Signed   By: Jasmine Pang M.D.   On: 02/03/2018 20:27    Procedures Procedures (including critical care time)  Medications Ordered in ED Medications - No data to display   Initial Impression / Assessment and Plan / ED Course  I have reviewed the triage vital  signs and the nursing notes. Patient with back pain.  No neurological deficits and normal neuro exam.  Patient can walk but states is painful.  No loss of bowel or bladder control.  No concern for cauda equina.  No fever, night sweats, weight loss, h/o cancer, IVDU.  RICE protocol and pain medicine indicated and discussed with patient.  Patient also with cough, CXR normal. Will treat for cough and congestion. Patient to f/u with [facility] and wellness.   Final Clinical Impressions(s) / ED Diagnoses   Final diagnoses:  Spasm of muscle of lower back  Cough    ED Discharge Orders         Ordered    guaifenesin (ROBITUSSIN) 100 MG/5ML syrup  Every 4 hours PRN     02/03/18 2108    predniSONE (DELTASONE) 10 MG tablet  Daily with breakfast     02/03/18 2108    cyclobenzaprine (FLEXERIL) 10 MG tablet  2 times daily PRN     02/03/18 2108           Kerrie [place], NP 02/03/18 2113    Little, Ambrose Finland, MD 02/04/18 0030

## 2018-02-03 NOTE — Discharge Instructions (Signed)
Do not drive while taking the muscle relaxer as it will make you sleepy. Follow up with [facility] and Wellness. Return here as needed.

## 2018-02-03 NOTE — ED Triage Notes (Signed)
Patient c/o mid and lower back pain that started today. Patient denies any injury or heavy lifting.  Patient also c/o a productive cough with yellow/clear sputum x a couple of months.

## 2018-02-10 ENCOUNTER — Encounter (HOSPITAL_COMMUNITY): Payer: Self-pay | Admitting: *Deleted

## 2018-02-10 ENCOUNTER — Other Ambulatory Visit: Payer: Self-pay

## 2018-02-10 DIAGNOSIS — Z79899 Other long term (current) drug therapy: Secondary | ICD-10-CM | POA: Insufficient documentation

## 2018-02-10 DIAGNOSIS — J101 Influenza due to other identified influenza virus with other respiratory manifestations: Secondary | ICD-10-CM | POA: Insufficient documentation

## 2018-02-10 DIAGNOSIS — J45909 Unspecified asthma, uncomplicated: Secondary | ICD-10-CM | POA: Insufficient documentation

## 2018-02-10 MED ORDER — ACETAMINOPHEN 325 MG PO TABS
650.0000 mg | ORAL_TABLET | Freq: Once | ORAL | Status: DC | PRN
  Filled 2018-02-10 (×2): qty 2

## 2018-02-10 NOTE — ED Triage Notes (Signed)
Flu like symptoms x 1 day with fever, body aches and slight cough

## 2018-02-10 NOTE — ED Notes (Signed)
Pt threw up Tylenol.

## 2018-02-11 ENCOUNTER — Emergency Department (HOSPITAL_COMMUNITY)
Admission: EM | Admit: 2018-02-11 | Discharge: 2018-02-11 | Disposition: A | Discharge status: Home / Self Care | Payer: Self-pay | Attending: Emergency Medicine | Admitting: Emergency Medicine

## 2018-02-11 DIAGNOSIS — J111 Influenza due to unidentified influenza virus with other respiratory manifestations: Secondary | ICD-10-CM

## 2018-02-11 MED ORDER — ALBUTEROL SULFATE HFA 108 (90 BASE) MCG/ACT IN AERS
2.0000 | INHALATION_SPRAY | Freq: Once | RESPIRATORY_TRACT | Status: AC
  Administered 2018-02-11: 2 via RESPIRATORY_TRACT
  Filled 2018-02-11: qty 6.7

## 2018-02-11 MED ORDER — PROMETHAZINE-DM 6.25-15 MG/5ML PO SYRP: 5.0000 mL | ORAL_SOLUTION | Freq: Four times a day (QID) | ORAL | 0 refills | Status: DC | PRN

## 2018-02-11 MED ORDER — ONDANSETRON 4 MG PO TBDP: 4.0000 mg | ORAL_TABLET | Freq: Three times a day (TID) | ORAL | 0 refills | Status: DC | PRN

## 2018-02-11 MED ORDER — ACETAMINOPHEN 160 MG/5ML PO SOLN
650.0000 mg | Freq: Once | ORAL | Status: AC
  Administered 2018-02-11: 650 mg via ORAL
  Filled 2018-02-11: qty 20.3

## 2018-02-11 MED ORDER — IPRATROPIUM-ALBUTEROL 0.5-2.5 (3) MG/3ML IN SOLN
3.0000 mL | Freq: Once | RESPIRATORY_TRACT | Status: AC
  Administered 2018-02-11: 3 mL via RESPIRATORY_TRACT
  Filled 2018-02-11: qty 3

## 2018-02-11 MED ORDER — IBUPROFEN 200 MG PO TABS: 600.0000 mg | ORAL_TABLET | Freq: Once | ORAL | Status: DC

## 2018-02-11 MED ORDER — ONDANSETRON 4 MG PO TBDP
4.0000 mg | ORAL_TABLET | Freq: Once | ORAL | Status: AC
  Administered 2018-02-11: 4 mg via ORAL
  Filled 2018-02-11: qty 1

## 2018-02-11 MED ORDER — AEROCHAMBER Z-STAT PLUS/MEDIUM MISC
1.0000 | Freq: Once | Status: AC
  Administered 2018-02-11: 1

## 2018-02-11 NOTE — ED Provider Notes (Signed)
[facility]-EMERGENCY DEPT Provider Note   CSN: [id number] Arrival date & time: 02/10/18  2213     History   Chief Complaint Chief Complaint  Patient presents with  . Fever  . Generalized Body Aches    HPI Kristin Massey is a 33 y.o. female with history of asthma who presents to the emergency department with a chief complaint of fever, chills, body aches, nonproductive cough, headache, nausea, and nonbloody, nonbilious emesis x2.  She reports that her symptoms began 24 hours prior to arrival.  She reports she has been around a family member who was diagnosed with the flu 2 days ago.  She denies abdominal pain, diarrhea, otalgia, rash, sinus pain or pressure, shortness of breath, or chest pain.  No treatment prior to arrival.  No language interpreter was used.    Past Medical History:  Diagnosis Date  . Asthma     There are no active problems to display for this patient.   History reviewed. No pertinent surgical history.   OB History   No obstetric history on file.      Home Medications    Prior to Admission medications   Medication Sig Start Date End Date Taking? Authorizing Provider  acetaminophen (TYLENOL) 325 MG tablet Take 1 tablet (325 mg total) by mouth every 6 (six) hours as needed. Patient not taking: Reported on 09/28/2015 08/08/13   Sciacca, Marissa, PA-C  albuterol (PROVENTIL HFA;VENTOLIN HFA) 108 (90 BASE) MCG/ACT inhaler Inhale 1-2 puffs into the lungs every 6 (six) hours as needed for wheezing or shortness of breath.    [provider]  cyclobenzaprine (FLEXERIL) 10 MG tablet Take 1 tablet (10 mg total) by mouth 2 (two) times daily as needed for muscle spasms. 02/03/18   Janne NapoleonNeese, Hope M, NP  fluticasone (FLONASE) 50 MCG/ACT nasal spray Place 2 sprays into both nostrils daily. FOR NASAL CONGESTION 04/13/16   Liberty HandyGibbons, Claudia J, PA-C  guaifenesin (ROBITUSSIN) 100 MG/5ML syrup Take 5-10 mLs (100-200 mg total) by mouth every 4 (four) hours  as needed for cough. 02/03/18   Janne NapoleonNeese, Hope M, NP  lidocaine (XYLOCAINE) 2 % solution Use as directed 15 mLs in the mouth or throat as needed for mouth pain. 08/05/17   Rise MuLeaphart, Kenneth T, PA-C  loratadine (CLARITIN) 10 MG tablet Take 1 tablet (10 mg total) by mouth daily. 04/13/16   Liberty HandyGibbons, Claudia J, PA-C  ondansetron (ZOFRAN ODT) 4 MG disintegrating tablet Take 1 tablet (4 mg total) by mouth every 8 (eight) hours as needed for nausea or vomiting. 02/11/18   McDonald, Mia A, PA-C  predniSONE (DELTASONE) 10 MG tablet Take 4 tablets (40 mg total) by mouth daily with breakfast. 02/03/18   Janne NapoleonNeese, Hope M, NP  promethazine-dextromethorphan (PROMETHAZINE-DM) 6.25-15 MG/5ML syrup Take 5 mLs by mouth 4 (four) times daily as needed for cough. 02/11/18   McDonald, Mia A, PA-C    Family History Family History  Problem Relation Age of Onset  . Cancer Mother   . Diabetes Other   . Hypertension Other     Social History Social History   Tobacco Use  . Smoking status: Never Smoker  . Smokeless tobacco: Never Used  Substance Use Topics  . Alcohol use: No  . Drug use: No     Allergies   Patient has no known allergies.   Review of Systems Review of Systems  Constitutional: Positive for chills and fever. Negative for activity change.  HENT: Positive for congestion and sore throat. Negative for  ear discharge, ear pain, sinus pressure and sinus pain.   Eyes: Negative for visual disturbance.  Respiratory: Positive for cough. Negative for shortness of breath.   Cardiovascular: Negative for chest pain.  Gastrointestinal: Positive for nausea and vomiting. Negative for abdominal pain, constipation and diarrhea.  Genitourinary: Negative for dysuria, frequency, hematuria, urgency, vaginal discharge and vaginal pain.  Musculoskeletal: Positive for myalgias. Negative for arthralgias, back pain, neck pain and neck stiffness.  Skin: Negative for rash and wound.  Allergic/Immunologic: Negative for  immunocompromised state.  Neurological: Negative for dizziness, weakness, numbness and headaches.  Psychiatric/Behavioral: Negative for confusion.   Physical Exam Updated Vital Signs BP 106/74 (BP Location: Right Arm)   Pulse 74   Temp 100 F (37.8 C) (Oral)   Resp 16   Ht 5\' 2"  (1.575 m)   Wt 80.2 kg   LMP 01/17/2018   SpO2 99%   BMI 32.34 kg/m   Physical Exam Vitals signs and nursing note reviewed.  Constitutional:      General: She is not in acute distress. HENT:     Head: Normocephalic.     Right Ear: Hearing and ear canal normal. There is no impacted cerumen. Tympanic membrane is erythematous. Tympanic membrane is not injected.     Left Ear: Hearing and ear canal normal. There is no impacted cerumen. Tympanic membrane is erythematous. Tympanic membrane is not injected.     Ears:     Comments: No mastoid tenderness bilaterally.     Nose: Congestion present. No rhinorrhea.     Right Sinus: No maxillary sinus tenderness or frontal sinus tenderness.     Left Sinus: No maxillary sinus tenderness or frontal sinus tenderness.     Mouth/Throat:     Pharynx: Posterior oropharyngeal erythema present. No pharyngeal swelling, oropharyngeal exudate or uvula swelling.     Tonsils: No tonsillar exudate or tonsillar abscesses.  Eyes:     Conjunctiva/sclera: Conjunctivae normal.  Neck:     Musculoskeletal: Neck supple.  Cardiovascular:     Rate and Rhythm: Normal rate and regular rhythm.     Heart sounds: No murmur. No friction rub. No gallop.   Pulmonary:     Effort: Pulmonary effort is normal. No respiratory distress.     Breath sounds: Normal breath sounds. No stridor. No wheezing, rhonchi or rales.     Comments: Lungs are clear to auscultation, dry coughing fit with deep inspiration. Lung sounds are not diminished.  Chest:     Chest wall: No tenderness.  Abdominal:     General: There is no distension.     Palpations: Abdomen is soft. There is no mass.     Tenderness: There  is no abdominal tenderness. There is no right CVA tenderness, left CVA tenderness, guarding or rebound.     Hernia: No hernia is present.  Skin:    General: Skin is warm.     Capillary Refill: Capillary refill takes less than 2 seconds.     Findings: No lesion or rash.  Neurological:     Mental Status: She is alert.  Psychiatric:        Behavior: Behavior normal.    ED Treatments / Results  Labs (all labs ordered are listed, but only abnormal results are displayed) Labs Reviewed - No data to display  EKG None  Radiology No results found.  Procedures Procedures (including critical care time)  Medications Ordered in ED Medications  acetaminophen (TYLENOL) tablet 650 mg (650 mg Oral Not Given 02/10/18 2309)  acetaminophen (TYLENOL) solution 650 mg (has no administration in time range)  albuterol (PROVENTIL HFA;VENTOLIN HFA) 108 (90 Base) MCG/ACT inhaler 2 puff (has no administration in time range)  aerochamber Z-Stat Plus/medium 1 each (has no administration in time range)  ibuprofen (ADVIL,MOTRIN) tablet 600 mg (has no administration in time range)  ipratropium-albuterol (DUONEB) 0.5-2.5 (3) MG/3ML nebulizer solution 3 mL (3 mLs Nebulization Given 02/11/18 0513)  ondansetron (ZOFRAN-ODT) disintegrating tablet 4 mg (4 mg Oral Given 02/11/18 0513)     Initial Impression / Assessment and Plan / ED Course  I have reviewed the triage vital signs and the nursing notes.  Pertinent labs & imaging results that were available during my care of the patient were reviewed by me and considered in my medical decision making (see chart for details).     33 year old female with with a history of asthma presenting with flulike symptoms for 1 day.  Febrile to 101.1 on arrival.  She was given Tylenol, but had one episode of emesis.  Zofran given.  Lungs are clear, but the patient has nonproductive cough with deep inspiration.  She was given a DuoNeb and was much improved.  Lungs remain clear to  auscultation bilaterally, but no additional episodes of coughing.  We will send the patient home with albuterol inhaler and spacer.  Shared decision making conversation regarding Tamiflu, which the patient declines at this time.  Will discharge home with symptomatic treatment.  She was successfully fluid challenged.  Fever has resolved.  She is in no acute distress.  She is safe for discharge to home with outpatient follow-up at this time.  Final Clinical Impressions(s) / ED Diagnoses   Final diagnoses:  Influenza    ED Discharge Orders         Ordered    ondansetron (ZOFRAN ODT) 4 MG disintegrating tablet  Every 8 hours PRN     02/11/18 0551    promethazine-dextromethorphan (PROMETHAZINE-DM) 6.25-15 MG/5ML syrup  4 times daily PRN     02/11/18 0551           McDonald, Mia A, PA-C 02/11/18 0733    Molpus, John, MD 02/11/18 2240

## 2018-02-11 NOTE — ED Notes (Signed)
Bed: WTR7 Expected date:  Expected time:  Means of arrival:  Comments: 

## 2018-02-11 NOTE — Discharge Instructions (Addendum)
Thank you for allowing me to care for you today in the Emergency Department.   Your symptoms are consistent with influenza.  To treat your symptoms, it is important to get good control of your fever and chills.  Take 650 mg of Tylenol once every 6 hours or 600 mg of ibuprofen with food once every 6 hours.  You can also alternate between these 2 medications every 3 hours.  Let 1 tablet of Zofran dissolve under your tongue every 8 hours as needed for nausea or vomiting.  You can also swallow 5 mL's of Promethazine DM for cough and nasal congestion.  If you continue to have episodes of dry cough, you can take 2 puffs of the albuterol inhaler with a spacer every 4 hours as needed.  You can return to work once you have been free from a fever (a temperature of 100.5 or greater) or free from diarrhea for at least 24 hours.  Return to the emergency department if you develop severe shortness of breath, a cough with thick productive sputum and shortness of breath, high fever despite taking ibuprofen and Tylenol as described above, persistent vomiting despite taking Zofran, or other new, concerning symptoms, please return to the emergency department for reevaluation.  If you have a primary care provider, you can follow-up for recheck of your symptoms in 3 to 4 days.  You should return to the emergency department if you develop

## 2018-10-11 ENCOUNTER — Other Ambulatory Visit: Payer: Self-pay

## 2018-10-11 ENCOUNTER — Emergency Department (HOSPITAL_BASED_OUTPATIENT_CLINIC_OR_DEPARTMENT_OTHER): Payer: Self-pay

## 2018-10-11 ENCOUNTER — Emergency Department (HOSPITAL_COMMUNITY)
Admission: EM | Admit: 2018-10-11 | Discharge: 2018-10-11 | Disposition: A | Discharge status: Home / Self Care | Payer: Self-pay | Attending: Emergency Medicine | Admitting: Emergency Medicine

## 2018-10-11 ENCOUNTER — Encounter (HOSPITAL_COMMUNITY): Payer: Self-pay | Admitting: Emergency Medicine

## 2018-10-11 DIAGNOSIS — R202 Paresthesia of skin: Secondary | ICD-10-CM | POA: Insufficient documentation

## 2018-10-11 DIAGNOSIS — R609 Edema, unspecified: Secondary | ICD-10-CM

## 2018-10-11 DIAGNOSIS — J45909 Unspecified asthma, uncomplicated: Secondary | ICD-10-CM | POA: Insufficient documentation

## 2018-10-11 NOTE — ED Notes (Signed)
US at bedside

## 2018-10-11 NOTE — ED Notes (Signed)
Pt reports that her Rt arm feels like it sleeping since this am.

## 2018-10-11 NOTE — Progress Notes (Signed)
VASCULAR LAB PRELIMINARY  PRELIMINARY  PRELIMINARY  PRELIMINARY  Right upper extremity venous duplex completed.    Preliminary report:  See CV proc for preliminary results.  Gave Dr. Maryan Rued results   Kristin Massey, Kristin Massey, RVT 10/11/2018, 8:15 PM

## 2018-10-11 NOTE — ED Triage Notes (Signed)
Pt reports since woke up around 615 am today had right hand/arm tingling. Pt has no deficits in triage. Went to bed around midnight and was fine.

## 2018-10-11 NOTE — ED Provider Notes (Signed)
[facility]-EMERGENCY DEPT Provider Note   CSN: [id number] Arrival date & time: 10/11/18  1401     History   Chief Complaint Chief Complaint  Patient presents with  . arm tingling    HPI Rilei Rauser is a 33 y.o. female.     Patient is a 33 year old female presenting today with paresthesias of her arm.  She states when she went to bed last night it was normal but when she woke up today from her upper arm down has felt like it is asleep with tingling.  She denies pain or weakness.  However she thought that her arm seems slightly swollen.  She has no known injury.  She does not do weight lifting or other heavy lifting.  She has never had any symptoms like this in the past.  She takes no medications on a daily basis.  Last menses was a few weeks ago.  The history is provided by the patient.    Past Medical History:  Diagnosis Date  . Asthma     There are no active problems to display for this patient.   History reviewed. No pertinent surgical history.   OB History   No obstetric history on file.      Home Medications    Prior to Admission medications   Medication Sig Start Date End Date Taking? Authorizing Provider  albuterol (PROVENTIL HFA;VENTOLIN HFA) 108 (90 BASE) MCG/ACT inhaler Inhale 1-2 puffs into the lungs every 6 (six) hours as needed for wheezing or shortness of breath.   Yes [provider]  levonorgestrel (MIRENA) 20 MCG/24HR IUD 1 each by Intrauterine route once.   Yes [provider]  acetaminophen (TYLENOL) 325 MG tablet Take 1 tablet (325 mg total) by mouth every 6 (six) hours as needed. Patient not taking: Reported on 09/28/2015 08/08/13   Sciacca, Ashok Cordia, PA-C  cyclobenzaprine (FLEXERIL) 10 MG tablet Take 1 tablet (10 mg total) by mouth 2 (two) times daily as needed for muscle spasms. Patient not taking: Reported on 10/11/2018 02/03/18   Janne Napoleon, NP  fluticasone [facility]) 50 MCG/ACT nasal spray Place 2 sprays  into both nostrils daily. FOR NASAL CONGESTION Patient not taking: Reported on 10/11/2018 04/13/16   Liberty Handy, PA-C  guaifenesin (ROBITUSSIN) 100 MG/5ML syrup Take 5-10 mLs (100-200 mg total) by mouth every 4 (four) hours as needed for cough. Patient not taking: Reported on 10/11/2018 02/03/18   Janne Napoleon, NP  lidocaine (XYLOCAINE) 2 % solution Use as directed 15 mLs in the mouth or throat as needed for mouth pain. Patient not taking: Reported on 10/11/2018 08/05/17   Demetrios Loll T, PA-C  loratadine (CLARITIN) 10 MG tablet Take 1 tablet (10 mg total) by mouth daily. Patient not taking: Reported on 10/11/2018 04/13/16   Liberty Handy, PA-C  ondansetron (ZOFRAN ODT) 4 MG disintegrating tablet Take 1 tablet (4 mg total) by mouth every 8 (eight) hours as needed for nausea or vomiting. Patient not taking: Reported on 10/11/2018 02/11/18   McDonald, Mia A, PA-C  predniSONE (DELTASONE) 10 MG tablet Take 4 tablets (40 mg total) by mouth daily with breakfast. Patient not taking: Reported on 10/11/2018 02/03/18   Janne Napoleon, NP  promethazine-dextromethorphan (PROMETHAZINE-DM) 6.25-15 MG/5ML syrup Take 5 mLs by mouth 4 (four) times daily as needed for cough. Patient not taking: Reported on 10/11/2018 02/11/18   Barkley Boards, PA-C    Family History Family History  Problem Relation Age of Onset  .  Cancer Mother   . Diabetes Other   . Hypertension Other     Social History Social History   Tobacco Use  . Smoking status: Never Smoker  . Smokeless tobacco: Never Used  Substance Use Topics  . Alcohol use: No  . Drug use: No     Allergies   Patient has no known allergies.   Review of Systems Review of Systems  All other systems reviewed and are negative.    Physical Exam Updated Vital Signs BP 115/78 (BP Location: Right Arm)   Pulse 64   Temp 98.7 F (37.1 C) (Oral)   Resp 18   LMP 10/01/2018   SpO2 100%   Physical Exam Vitals signs and nursing note reviewed.   Constitutional:      General: She is not in acute distress.    Appearance: Normal appearance. She is normal weight.  HENT:     Head: Normocephalic.     Mouth/Throat:     Mouth: Mucous membranes are moist.  Eyes:     Pupils: Pupils are equal, round, and reactive to light.  Cardiovascular:     Rate and Rhythm: Normal rate.     Pulses: Normal pulses.  Pulmonary:     Effort: Pulmonary effort is normal.  Skin:    Capillary Refill: Capillary refill takes 2 to 3 seconds.  Neurological:     General: No focal deficit present.     Mental Status: She is alert and oriented to person, place, and time.     Sensory: Sensory deficit present.     Motor: Motor function is intact. No weakness, atrophy or abnormal muscle tone.     Comments: Mild decreased sensation in the right upper arm compared to the left.  No nerve distribution noted but seems to be diffuse.  Right upper arm is slightly larger than the left upper arm based on tape measure.  No localized edema.  No clavicular pain or swelling  Psychiatric:        Mood and Affect: Mood normal.        Thought Content: Thought content normal.      ED Treatments / Results  Labs (all labs ordered are listed, but only abnormal results are displayed) Labs Reviewed - No data to display  EKG None  Radiology Vas [place] Upper Extremity Venous Duplex  Result Date: 10/11/2018 UPPER VENOUS STUDY  Indications: Swelling, and numbness Comparison Study: No prior study on file for comparsion Performing Technologist: Sharion Dove RVS  Examination Guidelines: A complete evaluation includes B-mode imaging, spectral Doppler, color Doppler, and power Doppler as needed of all accessible portions of each vessel. Bilateral testing is considered an integral part of a complete examination. Limited examinations for reoccurring indications may be performed as noted.  Right Findings: +----------+------------+---------+-----------+----------+-------+ RIGHT      CompressiblePhasicitySpontaneousPropertiesSummary +----------+------------+---------+-----------+----------+-------+ IJV                      Yes       Yes                      +----------+------------+---------+-----------+----------+-------+ Subclavian               Yes       Yes                      +----------+------------+---------+-----------+----------+-------+ Axillary                 Yes  Yes                      +----------+------------+---------+-----------+----------+-------+ Brachial      Full       Yes       Yes                      +----------+------------+---------+-----------+----------+-------+ Radial        Full                                          +----------+------------+---------+-----------+----------+-------+ Ulnar         Full                                          +----------+------------+---------+-----------+----------+-------+ Cephalic      Full                                          +----------+------------+---------+-----------+----------+-------+ Basilic       Full                                          +----------+------------+---------+-----------+----------+-------+  Left Findings: +----------+------------+---------+-----------+----------+-------+ LEFT      CompressiblePhasicitySpontaneousPropertiesSummary +----------+------------+---------+-----------+----------+-------+ Subclavian               Yes       Yes                      +----------+------------+---------+-----------+----------+-------+  Summary:  Right: No evidence of deep vein thrombosis in the upper extremity. No evidence of superficial vein thrombosis in the upper extremity.  Left: No evidence of thrombosis in the subclavian.  *See table(s) above for measurements and observations.    Preliminary     Procedures Procedures (including critical care time)  Medications Ordered in ED Medications - No data to display    Initial Impression / Assessment and Plan / ED Course  I have reviewed the triage vital signs and the nursing notes.  Pertinent labs & imaging results that were available during my care of the patient were reviewed by me and considered in my medical decision making (see chart for details).        Healthy young female presenting today with paresthesias of the right upper extremity.  She has no neck pain, recent injury and was present when she woke up this morning.  Patient does have 2+ radial pulse present bilaterally.  She has normal strength.  Slight swelling of the right upper extremity compared to the left based on measurement.  Ultrasound however is negative for any evidence of thrombosis.  Capillary refill is normal at 2. Most likely nerve related and the way she was sleeping.  Recommended that patient give the symptoms time but if they are not improved within 1 week needs follow-up with neurology. Final Clinical Impressions(s) / ED Diagnoses   Final diagnoses:  Paresthesia of right arm    ED Discharge Orders    None       Gwyneth SproutPlunkett, Yliana Gravois, MD 10/11/18 2052

## 2019-01-31 ENCOUNTER — Emergency Department (HOSPITAL_COMMUNITY)
Admission: EM | Admit: 2019-01-31 | Discharge: 2019-02-01 | Disposition: A | Discharge status: Home / Self Care | Payer: Medicaid Other | Attending: Emergency Medicine | Admitting: Emergency Medicine

## 2019-01-31 ENCOUNTER — Encounter (HOSPITAL_COMMUNITY): Payer: Self-pay

## 2019-01-31 ENCOUNTER — Other Ambulatory Visit: Payer: Self-pay

## 2019-01-31 DIAGNOSIS — H1032 Unspecified acute conjunctivitis, left eye: Secondary | ICD-10-CM | POA: Insufficient documentation

## 2019-01-31 DIAGNOSIS — J45909 Unspecified asthma, uncomplicated: Secondary | ICD-10-CM | POA: Insufficient documentation

## 2019-01-31 DIAGNOSIS — Z79899 Other long term (current) drug therapy: Secondary | ICD-10-CM | POA: Insufficient documentation

## 2019-01-31 MED ORDER — FLUORESCEIN SODIUM 1 MG OP STRP
1.0000 | ORAL_STRIP | Freq: Once | OPHTHALMIC | Status: AC
  Administered 2019-02-01: 1 via OPHTHALMIC
  Filled 2019-01-31: qty 1

## 2019-01-31 MED ORDER — POLYMYXIN B-TRIMETHOPRIM 10000-0.1 UNIT/ML-% OP SOLN: 1.0000 [drp] | OPHTHALMIC | 0 refills | Status: DC

## 2019-01-31 MED ORDER — TETRACAINE HCL 0.5 % OP SOLN
2.0000 [drp] | Freq: Once | OPHTHALMIC | Status: AC
  Administered 2019-02-01: 2 [drp] via OPHTHALMIC
  Filled 2019-01-31: qty 4

## 2019-01-31 MED ORDER — POLYMYXIN B-TRIMETHOPRIM 10000-0.1 UNIT/ML-% OP SOLN
2.0000 [drp] | OPHTHALMIC | Status: DC
  Administered 2019-02-01: 2 [drp] via OPHTHALMIC
  Filled 2019-01-31: qty 10

## 2019-01-31 NOTE — ED Provider Notes (Signed)
[facility]-EMERGENCY DEPT Provider Note   CSN: [id number] Arrival date & time: 01/31/19  2115     History Chief Complaint  Patient presents with  . Eye Pain    Kristin Massey is a 34 y.o. female.  HPI     This is a 34 year old female who presents with left eye pain, drainage, photophobia.  Patient reports that Kristin Massey woke up this morning and noted some clear drainage from her left eye.  Kristin Massey had progressive photophobia.  Kristin Massey states Kristin Massey has a history of similar symptoms related to her allergies.  Kristin Massey took Benadryl and allergic eyedrops with only temporary relief of her symptoms.  Kristin Massey denies any vision changes.  Kristin Massey maintains that Kristin Massey continues to have some clear drainage but no purulent drainage.  No fevers.  Denies upper respiratory symptoms including cough, fever, congestion.  Patient does not wear corrective lenses or contacts.  Past Medical History:  Diagnosis Date  . Asthma     There are no problems to display for this patient.   History reviewed. No pertinent surgical history.   OB History   No obstetric history on file.     Family History  Problem Relation Age of Onset  . Cancer Mother   . Diabetes Other   . Hypertension Other     Social History   Tobacco Use  . Smoking status: Never Smoker  . Smokeless tobacco: Never Used  Substance Use Topics  . Alcohol use: No  . Drug use: No    Home Medications Prior to Admission medications   Medication Sig Start Date End Date Taking? Authorizing Provider  acetaminophen (TYLENOL) 325 MG tablet Take 1 tablet (325 mg total) by mouth every 6 (six) hours as needed. Patient not taking: Reported on 09/28/2015 08/08/13   Sciacca, Marissa, PA-C  albuterol (PROVENTIL HFA;VENTOLIN HFA) 108 (90 BASE) MCG/ACT inhaler Inhale 1-2 puffs into the lungs every 6 (six) hours as needed for wheezing or shortness of breath.    [provider]  cyclobenzaprine (FLEXERIL) 10 MG tablet Take 1 tablet (10 mg total) by  mouth 2 (two) times daily as needed for muscle spasms. Patient not taking: Reported on 10/11/2018 02/03/18   Janne Napoleon, NP  fluticasone [facility]) 50 MCG/ACT nasal spray Place 2 sprays into both nostrils daily. FOR NASAL CONGESTION Patient not taking: Reported on 10/11/2018 04/13/16   Liberty Handy, PA-C  guaifenesin (ROBITUSSIN) 100 MG/5ML syrup Take 5-10 mLs (100-200 mg total) by mouth every 4 (four) hours as needed for cough. Patient not taking: Reported on 10/11/2018 02/03/18   Janne Napoleon, NP  levonorgestrel (MIRENA) 20 MCG/24HR IUD 1 each by Intrauterine route once.    [provider]  lidocaine (XYLOCAINE) 2 % solution Use as directed 15 mLs in the mouth or throat as needed for mouth pain. Patient not taking: Reported on 10/11/2018 08/05/17   Demetrios Loll T, PA-C  loratadine (CLARITIN) 10 MG tablet Take 1 tablet (10 mg total) by mouth daily. Patient not taking: Reported on 10/11/2018 04/13/16   Liberty Handy, PA-C  ondansetron (ZOFRAN ODT) 4 MG disintegrating tablet Take 1 tablet (4 mg total) by mouth every 8 (eight) hours as needed for nausea or vomiting. Patient not taking: Reported on 10/11/2018 02/11/18   McDonald, Mia A, PA-C  predniSONE (DELTASONE) 10 MG tablet Take 4 tablets (40 mg total) by mouth daily with breakfast. Patient not taking: Reported on 10/11/2018 02/03/18   Janne Napoleon, NP  promethazine-dextromethorphan (PROMETHAZINE-DM) 6.25-15  MG/5ML syrup Take 5 mLs by mouth 4 (four) times daily as needed for cough. Patient not taking: Reported on 10/11/2018 02/11/18   McDonald, Mia A, PA-C  trimethoprim-polymyxin b (POLYTRIM) ophthalmic solution Place 1 drop into the left eye every 4 (four) hours. 01/31/19   Lissett Favorite, Barbette Hair, MD    Allergies    Patient has no known allergies.  Review of Systems   Review of Systems  Constitutional: Negative for fever.  HENT: Negative for congestion and sore throat.   Eyes: Positive for photophobia, pain, discharge, redness and  itching. Negative for visual disturbance.  Respiratory: Negative for cough and shortness of breath.   All other systems reviewed and are negative.   Physical Exam Updated Vital Signs BP 118/78 (BP Location: Right Arm)   Pulse 67   Temp 99.9 F (37.7 C) (Oral)   Resp 15   Ht 1.575 m (5\' 2" )   Wt 81.6 kg   SpO2 100%   BMI 32.92 kg/m   Physical Exam Vitals and nursing note reviewed.  Constitutional:      Appearance: Kristin Massey is well-developed.     Comments: Overweight, nontoxic-appearing  HENT:     Head: Normocephalic and atraumatic.     Nose: Nose normal.     Mouth/Throat:     Mouth: Mucous membranes are moist.  Eyes:     Extraocular Movements: Extraocular movements intact.     Pupils: Pupils are equal, round, and reactive to light.     Comments: Pupils equal round and reactive,fluorescein exam without evidence of corneal abrasion or uptake, IOP left eye 14, injected conjunctiva See chart for visual acuity  Cardiovascular:     Rate and Rhythm: Normal rate and regular rhythm.  Pulmonary:     Effort: Pulmonary effort is normal. No respiratory distress.  Musculoskeletal:     Cervical back: Neck supple.  Skin:    General: Skin is warm and dry.  Neurological:     Mental Status: Kristin Massey is alert and oriented to person, place, and time.  Psychiatric:        Mood and Affect: Mood normal.     ED Results / Procedures / Treatments   Labs (all labs ordered are listed, but only abnormal results are displayed) Labs Reviewed - No data to display  EKG None  Radiology No results found.  Procedures Procedures (including critical care time)  Medications Ordered in ED Medications  tetracaine (PONTOCAINE) 0.5 % ophthalmic solution 2 drop (has no administration in time range)  fluorescein ophthalmic strip 1 strip (has no administration in time range)  trimethoprim-polymyxin b (POLYTRIM) ophthalmic solution 2 drop (has no administration in time range)    ED Course  I have reviewed  the triage vital signs and the nursing notes.  Pertinent labs & imaging results that were available during my care of the patient were reviewed by me and considered in my medical decision making (see chart for details).    MDM Rules/Calculators/A&P                       Patient presents with left eye pain, photophobia, drainage.  Kristin Massey is overall nontoxic and vital signs are reassuring.  Denies any additional respiratory symptoms.  Eye exam notable for jagged conjunctiva on the left, no evidence of corneal abrasion or defect, Kristin Massey has some clear drainage, visual acuity 20/40 on the right, and 20/200 on the left.  Patient was fairly difficult to examine secondary to significant photosensitivity.  Normal  IOP.  At this time suspect acute conjunctivitis.  Likely viral versus allergic but will cover for bacterial with Polytrim.  Low suspicion for preseptal or orbital cellulitis.  Recommend warm compresses.  Good hand hygiene.  Patient was given ophthalmology follow-up as needed.  After history, exam, and medical workup I feel the patient has been appropriately medically screened and is safe for discharge home. Pertinent diagnoses were discussed with the patient. Patient was given return precautions.   Final Clinical Impression(s) / ED Diagnoses Final diagnoses:  Acute conjunctivitis of left eye, unspecified acute conjunctivitis type    Rx / DC Orders ED Discharge Orders         Ordered    trimethoprim-polymyxin b (POLYTRIM) ophthalmic solution  Every 4 hours     01/31/19 2358           Shon Baton, MD 02/01/19 0006

## 2019-01-31 NOTE — ED Notes (Signed)
Pt ambulated to RR independently. Visual acuity Right Eye  20/40 Left eye 20/200. Pt can not open left eye fully and reports light sensitivity

## 2019-01-31 NOTE — ED Triage Notes (Signed)
Pt states that she woke up with swelling and pain in her L eye. Very sensitive to light. Denies any injury.

## 2020-04-25 ENCOUNTER — Encounter (HOSPITAL_COMMUNITY): Payer: Self-pay

## 2020-04-25 ENCOUNTER — Other Ambulatory Visit: Payer: Self-pay

## 2020-04-25 ENCOUNTER — Emergency Department (HOSPITAL_COMMUNITY)
Admission: EM | Admit: 2020-04-25 | Discharge: 2020-04-25 | Disposition: A | Discharge status: Home / Self Care | Payer: Medicaid Other | Attending: Emergency Medicine | Admitting: Emergency Medicine

## 2020-04-25 DIAGNOSIS — J45909 Unspecified asthma, uncomplicated: Secondary | ICD-10-CM | POA: Insufficient documentation

## 2020-04-25 DIAGNOSIS — J209 Acute bronchitis, unspecified: Secondary | ICD-10-CM | POA: Insufficient documentation

## 2020-04-25 MED ORDER — AEROCHAMBER PLUS FLO-VU MISC
1.0000 | Freq: Once | Status: AC
  Administered 2020-04-25: 1
  Filled 2020-04-25: qty 1

## 2020-04-25 MED ORDER — ALBUTEROL SULFATE HFA 108 (90 BASE) MCG/ACT IN AERS
2.0000 | INHALATION_SPRAY | RESPIRATORY_TRACT | Status: DC | PRN
  Administered 2020-04-25: 2 via RESPIRATORY_TRACT
  Filled 2020-04-25: qty 6.7

## 2020-04-25 NOTE — ED Provider Notes (Signed)
[facility]-EMERGENCY DEPT Provider Note: Lowella Dell, MD, FACEP  CSN: [id number] MRN: [id number] ARRIVAL: 04/25/20 at 0433 ROOM: WA17/WA17   CHIEF COMPLAINT  Cough   HISTORY OF PRESENT ILLNESS  04/25/20 5:00 AM Kristin Massey is a 35 y.o. female with a history of asthma.  She is here with a cough for approximately 1 week with intermittent exertional shortness of breath.  She is currently out of albuterol and would like a refill.  Symptoms have not been severe.  She describes the cough is nonproductive and she has had no fever.  She has also had an intermittent headache which she rates as a 5 out of 10 but does not like taking pills so declines any treatment for this.   Past Medical History:  Diagnosis Date  . Asthma     History reviewed. No pertinent surgical history.  Family History  Problem Relation Age of Onset  . Cancer Mother   . Diabetes Other   . Hypertension Other     Social History   Tobacco Use  . Smoking status: Never Smoker  . Smokeless tobacco: Never Used  Vaping Use  . Vaping Use: Never used  Substance Use Topics  . Alcohol use: No  . Drug use: No    Prior to Admission medications   Medication Sig Start Date End Date Taking? Authorizing Provider  albuterol (PROVENTIL HFA;VENTOLIN HFA) 108 (90 BASE) MCG/ACT inhaler Inhale 1-2 puffs into the lungs every 6 (six) hours as needed for wheezing or shortness of breath.    [provider]  levonorgestrel (MIRENA) 20 MCG/24HR IUD 1 each by Intrauterine route once.    [provider]  fluticasone (FLONASE) 50 MCG/ACT nasal spray Place 2 sprays into both nostrils daily. FOR NASAL CONGESTION Patient not taking: Reported on 10/11/2018 04/13/16 04/25/20  Liberty Handy, PA-C  loratadine (CLARITIN) 10 MG tablet Take 1 tablet (10 mg total) by mouth daily. Patient not taking: Reported on 10/11/2018 04/13/16 04/25/20  Liberty Handy, PA-C    Allergies Patient has no known allergies.   REVIEW OF  SYSTEMS  Negative except as noted here or in the History of Present Illness.   PHYSICAL EXAMINATION  Initial Vital Signs Blood pressure 120/74, pulse 70, temperature 98.7 F (37.1 C), temperature source Oral, resp. rate 18, height 5\' 2"  (1.575 m), weight 87 kg, SpO2 100 %.  Examination General: Well-developed, well-nourished female in no acute distress; appearance consistent with age of record HENT: normocephalic; atraumatic Eyes: pupils equal, round and reactive to light; extraocular muscles intact Neck: supple Heart: regular rate and rhythm; frequent dry cough Lungs: clear to auscultation bilaterally Abdomen: soft; nondistended; nontender; bowel sounds present Extremities: No deformity; full range of motion; pulses normal Neurologic: Awake, alert and oriented; motor function intact in all extremities and symmetric; no facial droop Skin: Warm and dry Psychiatric: Normal mood and affect   RESULTS  Summary of this visit's results, reviewed and interpreted by myself:   EKG Interpretation  Date/Time:    Ventricular Rate:    PR Interval:    QRS Duration:   QT Interval:    QTC Calculation:   R Axis:     Text Interpretation:        Laboratory Studies: No results found for this or any previous visit (from the past 24 hour(s)). Imaging Studies: No results found.  ED COURSE and MDM  Nursing notes, initial and subsequent vitals signs, including pulse oximetry, reviewed and interpreted by myself.  Vitals:   04/25/20  0445 04/25/20 0500  BP: 120/74 109/74  Pulse: 70 60  Resp: 18 20  Temp: 98.7 F (37.1 C)   TempSrc: Oral   SpO2: 100% 99%  Weight: 87 kg   Height: 5\' 2"  (1.575 m)    Medications  albuterol (VENTOLIN HFA) 108 (90 Base) MCG/ACT inhaler 2 puff (has no administration in time range)  aerochamber plus with mask device 1 each (has no administration in time range)    We will refill patient's albuterol inhaler.  Antibiotics are not indicated at this time as she  has been afebrile and her cough is nonproductive.  Her lungs are clear.  Given that it is spring her symptoms could be allergy related.  PROCEDURES  Procedures   ED DIAGNOSES     ICD-10-CM   1. Acute bronchitis with bronchospasm  J20.9        Venus Ruhe, 08-20-1988, MD 04/25/20 [phone]

## 2020-04-25 NOTE — ED Triage Notes (Signed)
Pt came in with c/o cough and headache. She states that it started approx one week ago. The headache has been on and off for a week. She has hx of h/a. Pt has hx of asthma and states that with exertion she gets SOB. Pt is currently out of albuterol.

## 2020-05-24 ENCOUNTER — Emergency Department (HOSPITAL_COMMUNITY): Payer: Self-pay

## 2020-05-24 ENCOUNTER — Encounter (HOSPITAL_COMMUNITY): Payer: Self-pay

## 2020-05-24 ENCOUNTER — Emergency Department (HOSPITAL_COMMUNITY)
Admission: EM | Admit: 2020-05-24 | Discharge: 2020-05-24 | Disposition: A | Discharge status: Home / Self Care | Payer: Self-pay | Attending: Emergency Medicine | Admitting: Emergency Medicine

## 2020-05-24 DIAGNOSIS — S92214A Nondisplaced fracture of cuboid bone of right foot, initial encounter for closed fracture: Secondary | ICD-10-CM | POA: Insufficient documentation

## 2020-05-24 DIAGNOSIS — R52 Pain, unspecified: Secondary | ICD-10-CM

## 2020-05-24 DIAGNOSIS — Y9301 Activity, walking, marching and hiking: Secondary | ICD-10-CM | POA: Insufficient documentation

## 2020-05-24 DIAGNOSIS — Y92009 Unspecified place in unspecified non-institutional (private) residence as the place of occurrence of the external cause: Secondary | ICD-10-CM | POA: Insufficient documentation

## 2020-05-24 DIAGNOSIS — J45909 Unspecified asthma, uncomplicated: Secondary | ICD-10-CM | POA: Insufficient documentation

## 2020-05-24 DIAGNOSIS — W19XXXA Unspecified fall, initial encounter: Secondary | ICD-10-CM | POA: Insufficient documentation

## 2020-05-24 MED ORDER — HYDROCODONE-ACETAMINOPHEN 5-325 MG PO TABS: 1.0000 | ORAL_TABLET | Freq: Four times a day (QID) | ORAL | 0 refills | Status: DC | PRN

## 2020-05-24 NOTE — ED Provider Notes (Signed)
[facility]-EMERGENCY DEPT Provider Note   CSN: [id number] Arrival date & time: 05/24/20  2110     History Chief Complaint  Patient presents with  . Ankle Pain    Kristin Massey is a 35 y.o. female who presents for evaluation of right foot pain after a mechanical fall that occurred yesterday. She reports she was walking down some steps and tripped on her shoe and fell. She reports she twisted her foot. Since then she has had pain and swelling noted to the dorsal aspect of her right foot. She reports pain when she tries to ambulate or bear weight. She denies any numbness/weakness.   The history is provided by the patient.       Past Medical History:  Diagnosis Date  . Asthma     There are no problems to display for this patient.   History reviewed. No pertinent surgical history.   OB History   No obstetric history on file.     Family History  Problem Relation Age of Onset  . Cancer Mother   . Diabetes Other   . Hypertension Other     Social History   Tobacco Use  . Smoking status: Never Smoker  . Smokeless tobacco: Never Used  Vaping Use  . Vaping Use: Never used  Substance Use Topics  . Alcohol use: No  . Drug use: No    Home Medications Prior to Admission medications   Medication Sig Start Date End Date Taking? Authorizing Provider  HYDROcodone-acetaminophen (NORCO/VICODIN) 5-325 MG tablet Take 1 tablet by mouth every 6 (six) hours as needed. 05/24/20  Yes Graciella Freer A, PA-C  albuterol (PROVENTIL HFA;VENTOLIN HFA) 108 (90 BASE) MCG/ACT inhaler Inhale 1-2 puffs into the lungs every 6 (six) hours as needed for wheezing or shortness of breath.    [provider]  levonorgestrel (MIRENA) 20 MCG/24HR IUD 1 each by Intrauterine route once.    [provider]  fluticasone (FLONASE) 50 MCG/ACT nasal spray Place 2 sprays into both nostrils daily. FOR NASAL CONGESTION Patient not taking: Reported on 10/11/2018 04/13/16 04/25/20   Liberty Handy, PA-C  loratadine (CLARITIN) 10 MG tablet Take 1 tablet (10 mg total) by mouth daily. Patient not taking: Reported on 10/11/2018 04/13/16 04/25/20  Liberty Handy, PA-C    Allergies    Patient has no known allergies.  Review of Systems   Review of Systems  Musculoskeletal:       Ankle pain  Neurological: Negative for weakness and numbness.  All other systems reviewed and are negative.   Physical Exam Updated Vital Signs BP 105/70 (BP Location: Right Arm)   Pulse 85   Temp 98 F (36.7 C) (Oral)   Resp 18   Ht 5\' 2"  (1.575 m)   Wt 81.6 kg   LMP 05/19/2020   SpO2 94%   BMI 32.92 kg/m   Physical Exam Vitals and nursing note reviewed.  Constitutional:      Appearance: She is well-developed.  HENT:     Head: Normocephalic and atraumatic.  Cardiovascular:     Pulses:          Dorsalis pedis pulses are 2+ on the right side and 2+ on the left side.  Pulmonary:     Effort: Pulmonary effort is normal.  Musculoskeletal:     Cervical back: Normal range of motion.       Legs:     Comments: Tenderness to palpation noted to the dorsal and lateral aspect of  the right foot with overlying soft tissue swelling. No deformity or crepitus noted. No bony tenderness noted to proximal tib/fib and distal tib/fib. No deficits with palpation of the achilles tendon. No bony tenderness noted to the LLE.   Skin:    General: Skin is warm and dry.     Capillary Refill: Capillary refill takes less than 2 seconds.     Comments: The skin is intact to ankle/foot.  The foot is warm and well perfused with intact sensation  Neurological:     Comments: Sensation intact throughout all major nerve distributions of the feet      ED Results / Procedures / Treatments   Labs (all labs ordered are listed, but only abnormal results are displayed) Labs Reviewed - No data to display  EKG None  Radiology DG Foot Complete Right  Result Date: 05/24/2020 CLINICAL DATA:  35 year old  female with trauma to the right foot. EXAM: RIGHT FOOT COMPLETE - 3+ VIEW COMPARISON:  None. FINDINGS: Faint linear lucency through the cuboid may represent vascular groove. A nondisplaced fracture is not entirely excluded. Correlation with point tenderness recommended. No other acute fracture. The bones are well mineralized. No arthritic changes. There is no dislocation. The soft tissues are unremarkable. IMPRESSION: Vascular groove versus possible nondisplaced fracture of the cuboid. Correlation with point tenderness recommended. Electronically Signed   By: Elgie Collard M.D.   On: 05/24/2020 21:49    Procedures Procedures   Medications Ordered in ED Medications - No data to display  ED Course  I have reviewed the triage vital signs and the nursing notes.  Pertinent labs & imaging results that were available during my care of the patient were reviewed by me and considered in my medical decision making (see chart for details).    MDM Rules/Calculators/A&P                          35 y.o. F who presents for evaluation of right foot/ankle pain after a mechanical fall. She reports since then she has had difficulty ambulating and bearing weight. No numbness/weakness. Patient is afebrile, non-toxic appearing, sitting comfortably on examination table. Vital signs reviewed and stable. Patient is neurovascularly intact.  On exam, she has tenderness, pain, swelling noted to dorsal aspect, lateral aspect of the right foot.  No deformity or crepitus noted.  Concern for fracture versus dislocation versus sprain.  X-rays ordered at triage.  X-rays reviewed.  There is a questionable cuboid fracture versus vascular groove.  She does have point tenderness to that area so we will plan to treat as fracture.   Discussed results with patient.  Patient placed in a cam walker boot and crutches.  Patient encouraged to follow-up with outpatient Ortho as directed.  Will give short course of pain medication for  acute/breakthrough pain. At this time, patient exhibits no emergent life-threatening condition that require further evaluation in ED. Patient had ample opportunity for questions and discussion. All patient's questions were answered with full understanding. Strict return precautions discussed. Patient expresses understanding and agreement to plan.   Portions of this note were generated with Scientist, clinical (histocompatibility and immunogenetics). Dictation errors may occur despite best attempts at proofreading.   Final Clinical Impression(s) / ED Diagnoses Final diagnoses:  Closed nondisplaced fracture of cuboid of right foot, initial encounter    Rx / DC Orders ED Discharge Orders         Ordered    HYDROcodone-acetaminophen (NORCO/VICODIN) 5-325 MG tablet  Every  6 hours PRN        05/24/20 2303           Rosana Hoes 05/24/20 2305    Mancel Bale, MD 05/26/20 [phone]

## 2020-05-24 NOTE — Discharge Instructions (Signed)
As we discussed, your x-ray showed a possible fracture to the area of the midfoot.  Given that this is where you are having tenderness, we will plan to treat as such.  Wear the cam walker boot and crutches until you follow-up with orthopedics.  Keep the foot elevated.  He can apply ice at home.  You can take Tylenol or Ibuprofen as directed for pain. You can alternate Tylenol and Ibuprofen every 4 hours. If you take Tylenol at 1pm, then you can take Ibuprofen at 5pm. Then you can take Tylenol again at 9pm.   Take pain medications as directed for break through pain. Do not drive or operate machinery while taking this medication.   Follow-up with referred orthopedic doctor.  Return to emergency department for any worsening pain, numbness/weakness, discoloration or any other worsening concerning symptoms.

## 2020-05-24 NOTE — ED Triage Notes (Signed)
Pt presents from home, states she fell yesterday and injured R foot. C/o pain and swelling since.

## 2020-07-23 ENCOUNTER — Encounter (HOSPITAL_COMMUNITY): Payer: Self-pay | Admitting: *Deleted

## 2020-07-23 ENCOUNTER — Emergency Department (HOSPITAL_COMMUNITY)
Admission: EM | Admit: 2020-07-23 | Discharge: 2020-07-24 | Disposition: A | Discharge status: Home / Self Care | Payer: No Typology Code available for payment source | Attending: Emergency Medicine | Admitting: Emergency Medicine

## 2020-07-23 ENCOUNTER — Emergency Department (HOSPITAL_COMMUNITY): Payer: No Typology Code available for payment source

## 2020-07-23 DIAGNOSIS — J45909 Unspecified asthma, uncomplicated: Secondary | ICD-10-CM | POA: Insufficient documentation

## 2020-07-23 DIAGNOSIS — M25561 Pain in right knee: Secondary | ICD-10-CM | POA: Diagnosis present

## 2020-07-23 DIAGNOSIS — Y92410 Unspecified street and highway as the place of occurrence of the external cause: Secondary | ICD-10-CM | POA: Insufficient documentation

## 2020-07-23 DIAGNOSIS — M79671 Pain in right foot: Secondary | ICD-10-CM | POA: Insufficient documentation
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
## 2020-07-24 NOTE — ED Notes (Addendum)
Pt seen by EDP and ready for discharge prior to RN.  See EDP notes.
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
## 2020-07-24 NOTE — Discharge Instructions (Addendum)
You were evaluated in the Emergency Department and after careful evaluation, we did not find any emergent condition requiring admission or further testing in the hospital.  Your exam/testing today was overall reassuring.  Ultimately was did not show any broken bones.  Recommend Tylenol or Motrin for discomfort over the next few days.  Please return to the Emergency Department if you experience any worsening of your condition.  Thank you for allowing [place] to be a part of your care.

## 2020-10-17 ENCOUNTER — Emergency Department (HOSPITAL_COMMUNITY)
Admission: EM | Admit: 2020-10-17 | Discharge: 2020-10-17 | Disposition: A | Discharge status: Home / Self Care | Payer: Medicaid Other | Attending: Emergency Medicine | Admitting: Emergency Medicine

## 2020-10-17 ENCOUNTER — Encounter (HOSPITAL_COMMUNITY): Payer: Self-pay | Admitting: *Deleted

## 2020-10-17 DIAGNOSIS — M542 Cervicalgia: Secondary | ICD-10-CM | POA: Insufficient documentation

## 2020-10-17 DIAGNOSIS — X501XXA Overexertion from prolonged static or awkward postures, initial encounter: Secondary | ICD-10-CM | POA: Insufficient documentation

## 2020-10-17 DIAGNOSIS — S46912A Strain of unspecified muscle, fascia and tendon at shoulder and upper arm level, left arm, initial encounter: Secondary | ICD-10-CM | POA: Insufficient documentation

## 2020-10-17 DIAGNOSIS — J45909 Unspecified asthma, uncomplicated: Secondary | ICD-10-CM | POA: Insufficient documentation

## 2020-10-17 MED ORDER — METHOCARBAMOL 500 MG PO TABS: 500.0000 mg | ORAL_TABLET | Freq: Two times a day (BID) | ORAL | 0 refills | Status: DC

## 2020-10-17 MED ORDER — LIDOCAINE 5 % EX PTCH: 1.0000 | MEDICATED_PATCH | CUTANEOUS | 0 refills | Status: DC

## 2020-10-17 NOTE — Discharge Instructions (Signed)
Your examination today is most concerning for a muscular injury 1. Medications: alternate ibuprofen and tylenol for pain control, take all usual home medications as they are prescribed 2. Treatment: rest, ice, elevate and use an ACE wrap or other compressive therapy to decrease swelling. Also drink plenty of fluids and do plenty of gentle stretching and move the affected muscle through its normal range of motion to prevent stiffness. 3. Follow Up: If your symptoms do not improve please follow up with orthopedics/sports medicine or your PCP for discussion of your diagnoses and further evaluation after today's visit; if you do not have a primary care doctor use the resource guide provided to find one; Please return to the ER for worsening symptoms or other concerns.    Please return to the emergency department if you develop any worsening symptoms such as numbness or tingling in your hands, inability to move shoulder or neck.

## 2020-10-17 NOTE — ED Triage Notes (Signed)
Pt complains of left shoulder pain and swelling x 3 days. She noticed pain after stretching her left shoulder.

## 2020-10-17 NOTE — ED Provider Notes (Signed)
[facility]-EMERGENCY DEPT Provider Note   CSN: [id number] Arrival date & time: 10/17/20  1051     History Chief Complaint  Patient presents with   Shoulder Pain    Kristin Massey is a 35 y.o. female.  Patient is a past medical history of asthma.  To the emergency department with pain in her left shoulder since Friday.  She said that it started while she was stretching the muscles in her back.  Denies any significant trauma to the area.  She describes her pain as 10 out of 10.  Pain is only present with movement.  She has pain of her posterior shoulder blade and cervical paraspinal muscles.  She has full range of motion of the shoulder however has significant pain when moving her shoulder.  She has tried using heating pads with no relief.  She is not usually take medications orally because she has difficulty swallowing pills.  She is okay to go home and crush medications if she needs.  Denies any numbness and tingling in her left arm.   Shoulder Pain Associated symptoms: neck pain   Associated symptoms: no back pain and no fever       Past Medical History:  Diagnosis Date   Asthma     There are no problems to display for this patient.   History reviewed. No pertinent surgical history.   OB History   No obstetric history on file.     Family History  Problem Relation Age of Onset   Cancer Mother    Diabetes Other    Hypertension Other     Social History   Tobacco Use   Smoking status: Never   Smokeless tobacco: Never  Vaping Use   Vaping Use: Never used  Substance Use Topics   Alcohol use: No   Drug use: No    Home Medications Prior to Admission medications   Medication Sig Start Date End Date Taking? Authorizing Provider  lidocaine (LIDODERM) 5 % Place 1 patch onto the skin daily. Remove & Discard patch within 12 hours or as directed by MD 10/17/20  Yes Berley Gambrell, Finis Bud, PA-C  methocarbamol (ROBAXIN) 500 MG tablet Take 1 tablet (500 mg  total) by mouth 2 (two) times daily. 10/17/20  Yes Declyn Delsol, Finis Bud, PA-C  albuterol (PROVENTIL HFA;VENTOLIN HFA) 108 (90 BASE) MCG/ACT inhaler Inhale 1-2 puffs into the lungs every 6 (six) hours as needed for wheezing or shortness of breath.    [provider]  HYDROcodone-acetaminophen (NORCO/VICODIN) 5-325 MG tablet Take 1 tablet by mouth every 6 (six) hours as needed. 05/24/20   Maxwell Caul, PA-C  levonorgestrel (MIRENA) 20 MCG/24HR IUD 1 each by Intrauterine route once.    [provider]  fluticasone (FLONASE) 50 MCG/ACT nasal spray Place 2 sprays into both nostrils daily. FOR NASAL CONGESTION Patient not taking: Reported on 10/11/2018 04/13/16 04/25/20  Liberty Handy, PA-C  loratadine (CLARITIN) 10 MG tablet Take 1 tablet (10 mg total) by mouth daily. Patient not taking: Reported on 10/11/2018 04/13/16 04/25/20  Liberty Handy, PA-C    Allergies    Patient has no known allergies.  Review of Systems   Review of Systems  Constitutional:  Negative for chills and fever.  HENT:  Negative for congestion and rhinorrhea.   Eyes:  Negative for visual disturbance.  Respiratory:  Negative for cough, chest tightness and shortness of breath.   Cardiovascular:  Negative for chest pain, palpitations and leg swelling.  Gastrointestinal:  Negative for abdominal pain, constipation, diarrhea, nausea and vomiting.  Genitourinary:  Negative for difficulty urinating.  Musculoskeletal:  Positive for arthralgias and neck pain. Negative for back pain.  Skin:  Negative for rash and wound.  Neurological:  Negative for dizziness, syncope, weakness, light-headedness, numbness and headaches.  All other systems reviewed and are negative.  Physical Exam Updated Vital Signs BP 111/72 (BP Location: Right Arm)   Pulse 67   Temp 98.3 F (36.8 C) (Oral)   Resp 18   LMP 09/19/2020   SpO2 100%   Physical Exam Vitals and nursing note reviewed.  Constitutional:      General: She is  not in acute distress.    Appearance: Normal appearance. She is not ill-appearing, toxic-appearing or diaphoretic.  HENT:     Head: Normocephalic and atraumatic.  Eyes:     General: No scleral icterus.       Right eye: No discharge.        Left eye: No discharge.     Conjunctiva/sclera: Conjunctivae normal.  Pulmonary:     Effort: Pulmonary effort is normal. No respiratory distress.  Musculoskeletal:     Right shoulder: Normal.     Left shoulder: Tenderness present. No swelling, deformity, bony tenderness or crepitus. Normal range of motion. Normal strength. Normal pulse.     Cervical back: Tenderness present. No swelling, deformity or bony tenderness. Normal range of motion.     Comments: Tenderness to palpation of paraspinal cervical muscles that extends into scapular region.  No bony tenderness of clavicle, AC joint, proximal humerus, cervical spine. Full ROM of shoulder, elbow and wrist Strength 5/5 and symmetric with hand grip, wrist flexion and extension, elbow flexion/extension and shoulder abduction/adduction and flexion/extension Sensation tested and grossly intact and symmetric in fingers and dorsal forearms  Radial pulses 2 + bilaterally   Skin:    General: Skin is warm and dry.  Neurological:     Mental Status: She is alert.  Psychiatric:        Mood and Affect: Mood normal.        Behavior: Behavior normal.    ED Results / Procedures / Treatments   Labs (all labs ordered are listed, but only abnormal results are displayed) Labs Reviewed - No data to display  EKG None  Radiology No results found.  Procedures Procedures   Medications Ordered in ED Medications - No data to display  ED Course  I have reviewed the triage vital signs and the nursing notes.  Pertinent labs & imaging results that were available during my care of the patient were reviewed by me and considered in my medical decision making (see chart for details).    MDM Rules/Calculators/A&P                          This is a well-appearing 35 year old female who presents to the emergency department with complaints of left shoulder pain.  She is hemodynamically stable and afebrile.  On exam she has full range of motion of her shoulder and neck.  No bony tenderness to clavicle, AC joint, cervical spine, proximal humerus.  No sensation deficit.  Radial pulses 2+.  Based on her presentation, she likely has a musculoskeletal strain of the paraspinal muscles.  No imaging is indicated at this time.  Patient will be discharged home with supportive care.  Provided prescription for muscle relaxer, lidocaine patch.  Given supportive care suggestions and discharge paperwork.    I  discussed this case in full with Dr. Silverio Lay.   Final Clinical Impression(s) / ED Diagnoses Final diagnoses:  Strain of left shoulder, initial encounter    Rx / DC Orders ED Discharge Orders          Ordered    methocarbamol (ROBAXIN) 500 MG tablet  2 times daily        10/17/20 1541    lidocaine (LIDODERM) 5 %  Every 24 hours        10/17/20 1541             Brieanna Nau, Finis Bud, PA-C 10/17/20 1553    Charlynne Pander, MD 10/17/20 2231

## 2021-07-23 ENCOUNTER — Other Ambulatory Visit: Payer: Self-pay

## 2021-07-23 ENCOUNTER — Emergency Department (HOSPITAL_COMMUNITY)
Admission: EM | Admit: 2021-07-23 | Discharge: 2021-07-23 | Disposition: A | Discharge status: Home / Self Care | Payer: Medicaid Other | Attending: Emergency Medicine | Admitting: Emergency Medicine

## 2021-07-23 ENCOUNTER — Encounter (HOSPITAL_COMMUNITY): Payer: Self-pay | Admitting: Emergency Medicine

## 2021-07-23 DIAGNOSIS — J45909 Unspecified asthma, uncomplicated: Secondary | ICD-10-CM | POA: Insufficient documentation

## 2021-07-23 DIAGNOSIS — R051 Acute cough: Secondary | ICD-10-CM

## 2021-07-23 MED ORDER — ALBUTEROL SULFATE (5 MG/ML) 0.5% IN NEBU: 2.5000 mg | INHALATION_SOLUTION | Freq: Four times a day (QID) | RESPIRATORY_TRACT | 12 refills | Status: AC | PRN

## 2021-07-23 MED ORDER — IPRATROPIUM-ALBUTEROL 0.5-2.5 (3) MG/3ML IN SOLN
3.0000 mL | Freq: Once | RESPIRATORY_TRACT | Status: AC
  Administered 2021-07-23: 3 mL via RESPIRATORY_TRACT
  Filled 2021-07-23: qty 3

## 2021-07-23 MED ORDER — ALBUTEROL SULFATE HFA 108 (90 BASE) MCG/ACT IN AERS: 1.0000 | INHALATION_SPRAY | Freq: Four times a day (QID) | RESPIRATORY_TRACT | 3 refills | Status: AC | PRN

## 2021-07-23 NOTE — ED Triage Notes (Signed)
Patient reports dry cough x 1 week. States it is her asthma acting up and is requesting an inhaler refill. Patient speaking in full sentences and in NAD.

## 2021-10-05 ENCOUNTER — Other Ambulatory Visit: Payer: Self-pay

## 2021-10-05 ENCOUNTER — Emergency Department (HOSPITAL_COMMUNITY)
Admission: EM | Admit: 2021-10-05 | Discharge: 2021-10-05 | Disposition: A | Discharge status: Home / Self Care | Payer: Medicaid Other | Attending: Emergency Medicine | Admitting: Emergency Medicine

## 2021-10-05 ENCOUNTER — Encounter (HOSPITAL_COMMUNITY): Payer: Self-pay

## 2021-10-05 DIAGNOSIS — J029 Acute pharyngitis, unspecified: Secondary | ICD-10-CM

## 2021-10-05 DIAGNOSIS — R519 Headache, unspecified: Secondary | ICD-10-CM | POA: Insufficient documentation

## 2021-10-05 DIAGNOSIS — Z20822 Contact with and (suspected) exposure to covid-19: Secondary | ICD-10-CM | POA: Insufficient documentation

## 2021-10-05 DIAGNOSIS — Z7951 Long term (current) use of inhaled steroids: Secondary | ICD-10-CM | POA: Insufficient documentation

## 2021-10-05 DIAGNOSIS — J45909 Unspecified asthma, uncomplicated: Secondary | ICD-10-CM | POA: Insufficient documentation

## 2021-10-05 LAB — RESP PANEL BY RT-PCR (FLU A&B, COVID) ARPGX2
Influenza A by PCR: NEGATIVE
Influenza B by PCR: NEGATIVE
SARS Coronavirus 2 by RT PCR: NEGATIVE

## 2021-10-05 NOTE — ED Provider Notes (Signed)
[facility]-EMERGENCY DEPT Provider Note   CSN: [id number] Arrival date & time: 10/05/21  0840     History  Chief Complaint  Patient presents with   Headache   Sore Throat   Medication Refill    Kristin Massey is a 36 y.o. female.  Patient complains of an intermittent headache for the past few days.  Also complains of sore throat for the past 2 days.  Patient states she is here requesting refills of her nebulizer medication and a prescription for an albuterol inhaler.  Patient states she has a history of asthma.  She does not currently have primary care.  She denies fever, chest pain, shortness of breath, cough, abdominal pain, nausea, vomiting.  Patient has past medical history significant for asthma  HPI     Home Medications Prior to Admission medications   Medication Sig Start Date End Date Taking? Authorizing Provider  albuterol (PROVENTIL) (5 MG/ML) 0.5% nebulizer solution Take 0.5 mLs (2.5 mg total) by nebulization every 6 (six) hours as needed for wheezing or shortness of breath. 07/23/21   Smoot, Shawn Route, PA-C  albuterol (VENTOLIN HFA) 108 (90 Base) MCG/ACT inhaler Inhale 1-2 puffs into the lungs every 6 (six) hours as needed for wheezing or shortness of breath. 07/23/21   Smoot, Shawn Route, PA-C  HYDROcodone-acetaminophen (NORCO/VICODIN) 5-325 MG tablet Take 1 tablet by mouth every 6 (six) hours as needed. 05/24/20   Maxwell Caul, PA-C  levonorgestrel (MIRENA) 20 MCG/24HR IUD 1 each by Intrauterine route once.    [provider]  lidocaine (LIDODERM) 5 % Place 1 patch onto the skin daily. Remove & Discard patch within 12 hours or as directed by MD 10/17/20   Loeffler, Finis Bud, PA-C  methocarbamol (ROBAXIN) 500 MG tablet Take 1 tablet (500 mg total) by mouth 2 (two) times daily. 10/17/20   Loeffler, Finis Bud, PA-C  fluticasone (FLONASE) 50 MCG/ACT nasal spray Place 2 sprays into both nostrils daily. FOR NASAL CONGESTION Patient not taking: Reported on  10/11/2018 04/13/16 04/25/20  Liberty Handy, PA-C  loratadine (CLARITIN) 10 MG tablet Take 1 tablet (10 mg total) by mouth daily. Patient not taking: Reported on 10/11/2018 04/13/16 04/25/20  Liberty Handy, PA-C      Allergies    Patient has no known allergies.    Review of Systems   Review of Systems  Constitutional:  Negative for chills and fever.  HENT:  Positive for sore throat.   Respiratory:  Negative for cough, shortness of breath and wheezing.   Cardiovascular:  Negative for chest pain.  Gastrointestinal:  Negative for abdominal pain.  Genitourinary:  Negative for dysuria.    Physical Exam Updated Vital Signs BP 108/72 (BP Location: Right Arm)   Pulse 65   Temp 98.4 F (36.9 C) (Oral)   Resp 12   Ht 5\' 2"  (1.575 m)   Wt 84.4 kg   LMP 09/20/2021   SpO2 100%   BMI 34.02 kg/m  Physical Exam Vitals and nursing note reviewed.  Constitutional:      General: She is not in acute distress. HENT:     Head: Normocephalic and atraumatic.     Mouth/Throat:     Mouth: Mucous membranes are moist.     Pharynx: Oropharynx is clear. Uvula midline. No oropharyngeal exudate or posterior oropharyngeal erythema.     Tonsils: No tonsillar exudate or tonsillar abscesses.  Eyes:     Extraocular Movements: Extraocular movements intact.  Cardiovascular:  Rate and Rhythm: Normal rate and regular rhythm.  Pulmonary:     Effort: Pulmonary effort is normal.     Breath sounds: Normal breath sounds.  Abdominal:     Palpations: Abdomen is soft.  Musculoskeletal:        General: Normal range of motion.     Cervical back: Normal range of motion and neck supple.  Skin:    General: Skin is warm and dry.     Capillary Refill: Capillary refill takes less than 2 seconds.  Neurological:     Mental Status: She is alert.  Psychiatric:        Mood and Affect: Mood normal.     ED Results / Procedures / Treatments   Labs (all labs ordered are listed, but only abnormal results are  displayed) Labs Reviewed  RESP PANEL BY RT-PCR (FLU A&B, COVID) ARPGX2    EKG None  Radiology No results found.  Procedures Procedures    Medications Ordered in ED Medications - No data to display  ED Course/ Medical Decision Making/ A&P                           Medical Decision Making  Patient presents with a chief complaint of sore throat.  Differential includes viral pharyngitis, strep pharyngitis, mononucleosis, and others.  Patient's headache has resolved  I reviewed the patient's past medical history and see emergency department visits for bronchitis and cough and no primary care visits over the past 2 years.  I ordered labs.  Pertinent results include  The patient has no exudate and no signs of strep pharyngitis at this time.  Her sore throat appears to be likely a viral process.  COVID test was negative.  Plan to discharge patient home at this time with refills of her medication.  Chart review shows the patient has refills remaining for her rescue inhaler and nebulizer solution.  Patient will need primary care evaluation for her asthma.  Recommend she establish PCP when able.  She may use over-the-counter medication as needed for symptom control for her headaches and sore throat.        Final Clinical Impression(s) / ED Diagnoses Final diagnoses:  Pharyngitis, unspecified etiology    Rx / DC Orders ED Discharge Orders     None         Pamala Duffel 10/05/21 1410    Loetta Rough, MD 10/05/21 [phone]

## 2021-10-05 NOTE — Discharge Instructions (Addendum)
You were seen today for sore throat.  Your testing was negative for COVID and influenza.  I do not believe you have strep throat at this time.  Please use over-the-counter medication as needed for symptom control.  You have refills remaining for your nebulizer solution and for your inhaler.  These are available at the [facility] on [address] at the [employer] of [place] and [address].  If you develop life-threatening shortness of breath, chest pain, or other life-threatening conditions, please return to the emergency department

## 2021-10-05 NOTE — ED Triage Notes (Addendum)
Patient c/o headache x 3 days and a sore throat x 2 days.  Patient also is requesting a prescription for an albuterol inhaler and medication for her nebulizer.

## 2021-10-05 NOTE — ED Notes (Signed)
An After Visit Summary was printed and given to the patient. Discharge instructions given and no further questions at this time.  

## 2021-12-27 ENCOUNTER — Emergency Department (HOSPITAL_COMMUNITY)
Admission: EM | Admit: 2021-12-27 | Discharge: 2021-12-27 | Disposition: A | Discharge status: Home / Self Care | Payer: Medicaid Other | Attending: Emergency Medicine | Admitting: Emergency Medicine

## 2021-12-27 ENCOUNTER — Other Ambulatory Visit: Payer: Self-pay

## 2021-12-27 DIAGNOSIS — J020 Streptococcal pharyngitis: Secondary | ICD-10-CM | POA: Insufficient documentation

## 2021-12-27 DIAGNOSIS — Z1152 Encounter for screening for COVID-19: Secondary | ICD-10-CM | POA: Insufficient documentation

## 2021-12-27 DIAGNOSIS — J45909 Unspecified asthma, uncomplicated: Secondary | ICD-10-CM | POA: Insufficient documentation

## 2021-12-27 LAB — RESP PANEL BY RT-PCR (FLU A&B, COVID) ARPGX2
Influenza A by PCR: NEGATIVE
Influenza B by PCR: NEGATIVE
SARS Coronavirus 2 by RT PCR: NEGATIVE

## 2021-12-27 LAB — GROUP A STREP BY PCR: Group A Strep by PCR: DETECTED — AB

## 2021-12-27 MED ORDER — PENICILLIN V POTASSIUM 125 MG/5ML PO SOLR: 500.0000 mg | Freq: Three times a day (TID) | ORAL | 0 refills | Status: AC

## 2021-12-27 MED ORDER — ACETAMINOPHEN 325 MG PO TABS
650.0000 mg | ORAL_TABLET | Freq: Once | ORAL | Status: AC
  Administered 2021-12-27: 650 mg via ORAL
  Filled 2021-12-27: qty 2

## 2021-12-27 NOTE — Discharge Instructions (Addendum)
You were diagnosed today with strep throat.  Please take the prescribed antibiotic.  I did order this in the liquid form since you have a difficult time swallowing pills.  Please take over-the-counter medication such as Advil or Tylenol as needed for fever control.  I provided a work note for you to return to work once you are fever has broken and you are no longer contagious.

## 2021-12-27 NOTE — ED Provider Triage Note (Signed)
Emergency Medicine Provider Triage Evaluation Note  Kristin Massey , a 36 y.o. female  was evaluated in triage.  Pt complains of 3 days of sore throat.  She denies fevers, nausea, vomiting.  She states that she has also had a cough for 1 to 2 days.  Her sister has had similar symptoms and was diagnosed with strep throat this morning.  Review of Systems  Positive: As above Negative: As above  Physical Exam  BP 115/68   Pulse 73   Temp 98.5 F (36.9 C) (Oral)   Resp 17   Ht 5\' 2"  (1.575 m)   Wt 85 kg   SpO2 100%   BMI 34.27 kg/m  Gen:   Awake, no distress   Resp:  Normal effort  MSK:   Moves extremities without difficulty  Other:  Erythematous posterior oropharynx, midline uvula, no exudate noted, no cervical lymphadenopathy noted  Medical Decision Making  Medically screening exam initiated at 1:07 PM.  Appropriate orders placed.  Kristin Massey was informed that the remainder of the evaluation will be completed by another provider, this initial triage assessment does not replace that evaluation, and the importance of remaining in the ED until their evaluation is complete.     Iona Hansen, PA-C 12/27/21 1308

## 2021-12-27 NOTE — ED Triage Notes (Signed)
Pt reports sore throat and difficulty swallowing x 3 days. Sibling tested positive for strep throat.

## 2021-12-27 NOTE — ED Provider Notes (Signed)
[facility]-EMERGENCY DEPT Provider Note   CSN: [id number] Arrival date & time: 12/27/21  1259     History  Chief Complaint  Patient presents with   Sore Throat    Kristin Massey is a 36 y.o. female.  Patient presents the emergency department complaining of 3 days of sore throat and 1 to 2 days of cough.  She denies fevers, nausea, vomiting, abdominal pain, shortness of breath.  She also denies difficulty with swallowing.  She states that her sister had similar symptoms and was diagnosed with strep throat this morning.  Past medical history significant for asthma  HPI     Home Medications Prior to Admission medications   Medication Sig Start Date End Date Taking? Authorizing Provider  penicillin potassium (VEETID) 125 MG/5ML solution Take 20 mLs (500 mg total) by mouth 3 (three) times daily for 7 days. 12/27/21 01/03/22 Yes Barrie Dunker B, PA-C  albuterol (PROVENTIL) (5 MG/ML) 0.5% nebulizer solution Take 0.5 mLs (2.5 mg total) by nebulization every 6 (six) hours as needed for wheezing or shortness of breath. 07/23/21   Smoot, Shawn Route, PA-C  albuterol (VENTOLIN HFA) 108 (90 Base) MCG/ACT inhaler Inhale 1-2 puffs into the lungs every 6 (six) hours as needed for wheezing or shortness of breath. 07/23/21   Smoot, Shawn Route, PA-C  HYDROcodone-acetaminophen (NORCO/VICODIN) 5-325 MG tablet Take 1 tablet by mouth every 6 (six) hours as needed. 05/24/20   Maxwell Caul, PA-C  levonorgestrel (MIRENA) 20 MCG/24HR IUD 1 each by Intrauterine route once.    [provider]  lidocaine (LIDODERM) 5 % Place 1 patch onto the skin daily. Remove & Discard patch within 12 hours or as directed by MD 10/17/20   Loeffler, Finis Bud, PA-C  methocarbamol (ROBAXIN) 500 MG tablet Take 1 tablet (500 mg total) by mouth 2 (two) times daily. 10/17/20   Loeffler, Finis Bud, PA-C  fluticasone (FLONASE) 50 MCG/ACT nasal spray Place 2 sprays into both nostrils daily. FOR NASAL CONGESTION Patient  not taking: Reported on 10/11/2018 04/13/16 04/25/20  Liberty Handy, PA-C  loratadine (CLARITIN) 10 MG tablet Take 1 tablet (10 mg total) by mouth daily. Patient not taking: Reported on 10/11/2018 04/13/16 04/25/20  Liberty Handy, PA-C      Allergies    Patient has no known allergies.    Review of Systems   Review of Systems  Constitutional:  Negative for fever.  HENT:  Positive for sore throat. Negative for congestion and drooling.   Respiratory:  Positive for cough. Negative for shortness of breath.   Gastrointestinal:  Negative for abdominal pain, nausea and vomiting.    Physical Exam Updated Vital Signs BP 115/68   Pulse 73   Temp 98.5 F (36.9 C) (Oral)   Resp 17   Ht 5\' 2"  (1.575 m)   Wt 85 kg   SpO2 100%   BMI 34.27 kg/m  Physical Exam HENT:     Head: Normocephalic and atraumatic.     Nose: No congestion or rhinorrhea.     Mouth/Throat:     Mouth: Mucous membranes are moist.     Pharynx: Uvula midline. Posterior oropharyngeal erythema present. No pharyngeal swelling or oropharyngeal exudate.     Tonsils: No tonsillar exudate or tonsillar abscesses.  Eyes:     Pupils: Pupils are equal, round, and reactive to light.  Cardiovascular:     Rate and Rhythm: Normal rate and regular rhythm.     Heart sounds: Normal heart sounds.  Pulmonary:  Effort: Pulmonary effort is normal. No respiratory distress.     Breath sounds: Normal breath sounds.  Abdominal:     Palpations: Abdomen is soft.  Musculoskeletal:        General: No signs of injury.     Cervical back: Normal range of motion.  Skin:    General: Skin is dry.  Neurological:     Mental Status: She is alert.  Psychiatric:        Speech: Speech normal.        Behavior: Behavior normal.     ED Results / Procedures / Treatments   Labs (all labs ordered are listed, but only abnormal results are displayed) Labs Reviewed  GROUP A STREP BY PCR - Abnormal; Notable for the following components:       Result Value   Group A Strep by PCR DETECTED (*)    All other components within normal limits  RESP PANEL BY RT-PCR (FLU A&B, COVID) ARPGX2    EKG None  Radiology No results found.  Procedures Procedures    Medications Ordered in ED Medications  acetaminophen (TYLENOL) tablet 650 mg (650 mg Oral Given 12/27/21 1319)    ED Course/ Medical Decision Making/ A&P Clinical Course as of 12/27/21 1432  Wed Dec 27, 2021  1422 Group A Strep by PCR(!): DETECTED [JL]    Clinical Course User Index [JL] Regan Lemming, MD                           Medical Decision Making Amount and/or Complexity of Data Reviewed Labs:  Decision-making details documented in ED Course.  Risk OTC drugs. Prescription drug management.   This patient presents to the ED for concern of sore throat, this involves an extensive number of treatment options, and is a complaint that carries with it a high risk of complications and morbidity.  The differential diagnosis includes strep throat, COVID, influenza, other viral illnesses, and others   Co morbidities that complicate the patient evaluation  Asthma   Lab Tests:  I Ordered, and personally interpreted labs.  The pertinent results include: Positive group A strep, COVID and influenza testing are pending   Problem List / ED Course / Critical interventions / Medication management  I ordered medication including Tylenol for sore throat Reevaluation of the patient after these medicines showed that the patient improved I have reviewed the patients home medicines and have made adjustments as needed   Test / Admission - Considered:  The patient tested positive for group A strep.  Plan to discharge patient home with course of penicillin.  Patient states she is unable to swallow pills so liquid penicillin was ordered.  Patient will use over-the-counter medications such as Tylenol or Advil as needed for fever control.        Final Clinical  Impression(s) / ED Diagnoses Final diagnoses:  Strep throat    Rx / DC Orders ED Discharge Orders          Ordered    penicillin potassium (VEETID) 125 MG/5ML solution  3 times daily        12/27/21 1423              Ronny Bacon 12/27/21 1432    Regan Lemming, MD 12/27/21 1907

## 2022-04-23 ENCOUNTER — Encounter (HOSPITAL_COMMUNITY): Payer: Self-pay

## 2022-04-23 ENCOUNTER — Ambulatory Visit (HOSPITAL_COMMUNITY)
Admission: EM | Admit: 2022-04-23 | Discharge: 2022-04-23 | Disposition: A | Discharge status: Home / Self Care | Payer: Medicaid Other

## 2022-04-23 DIAGNOSIS — M79601 Pain in right arm: Secondary | ICD-10-CM

## 2022-04-23 NOTE — Discharge Instructions (Signed)
Take ibuprofen 600 mg (3 over-the-counter 200 mg pills) every 6 hours as needed for pain and inflammation to the right upper extremity as a result of the arm injury you sustained today.  Apply ice to the right arm and elevate to relieve swelling.  Your work note is at the end of your packet.  If you develop any new or worsening symptoms or do not improve in the next 2 to 3 days, please return.  If your symptoms are severe, please go to the emergency room.  Follow-up with your primary care provider for further evaluation and management of your symptoms as well as ongoing wellness visits.  I hope you feel better!

## 2022-04-23 NOTE — ED Triage Notes (Signed)
Pt states got her rt arm stuck in her couch this morning. States has bruising. Denies taking any meds. States needs a work note.

## 2022-04-23 NOTE — ED Provider Notes (Signed)
[facility]    CSN: [id number] Arrival date & time: 04/23/22  [place]      History   Chief Complaint Chief Complaint  Patient presents with   Arm Injury    HPI Kristin Massey is a 37 y.o. female.   Patient presents to urgent care for evaluation of right arm pain that started this morning after she got her right upper arm stuck between the wall and her couch cushion.  She states she was able to free her arm without difficulty.  The outer aspect of the right upper arm began to bruise this afternoon.  She has full range of motion of the right arm and is not experiencing any pain at the shoulder, elbow, wrist, or hand joints.  Experiencing tenderness to lateral right bicep.  No numbness or tingling distal to injury.  No breaks to the skin, laceration, abrasion, or decreased strength to the right upper extremity.  She has not attempted use of any over-the-counter medications before coming to urgent care for symptoms.   Arm Injury   Past Medical History:  Diagnosis Date   Asthma     There are no problems to display for this patient.   History reviewed. No pertinent surgical history.  OB History   No obstetric history on file.      Home Medications    Prior to Admission medications   Medication Sig Start Date End Date Taking? Authorizing Provider  albuterol (PROVENTIL) (5 MG/ML) 0.5% nebulizer solution Take 0.5 mLs (2.5 mg total) by nebulization every 6 (six) hours as needed for wheezing or shortness of breath. 07/23/21   Smoot, Leary Roca, PA-C  albuterol (VENTOLIN HFA) 108 (90 Base) MCG/ACT inhaler Inhale 1-2 puffs into the lungs every 6 (six) hours as needed for wheezing or shortness of breath. 07/23/21   Smoot, Leary Roca, PA-C  levonorgestrel (MIRENA) 20 MCG/24HR IUD 1 each by Intrauterine route once.    [provider]  fluticasone (FLONASE) 50 MCG/ACT nasal spray Place 2 sprays into both nostrils daily. FOR NASAL CONGESTION Patient not taking: Reported on  10/11/2018 04/13/16 04/25/20  Kinnie Feil, PA-C  loratadine (CLARITIN) 10 MG tablet Take 1 tablet (10 mg total) by mouth daily. Patient not taking: Reported on 10/11/2018 04/13/16 04/25/20  Kinnie Feil, PA-C    Family History Family History  Problem Relation Age of Onset   Cancer Mother    Diabetes Other    Hypertension Other     Social History Social History   Tobacco Use   Smoking status: Never   Smokeless tobacco: Never  Vaping Use   Vaping Use: Never used  Substance Use Topics   Alcohol use: No   Drug use: Yes    Types: Marijuana     Allergies   Patient has no known allergies.   Review of Systems Review of Systems Per HPI  Physical Exam Triage Vital Signs ED Triage Vitals  Enc Vitals Group     BP 04/23/22 2005 119/80     Pulse Rate 04/23/22 2003 (!) 57     Resp 04/23/22 2003 18     Temp 04/23/22 2003 98.3 F (36.8 C)     Temp Source 04/23/22 2003 Oral     SpO2 04/23/22 2003 96 %     Weight --      Height --      Head Circumference --      Peak Flow --      Pain Score 04/23/22 2004 8  Pain Loc --      Pain Edu? --      Excl. in [place]? --    No data found.  Updated Vital Signs BP 119/80 (BP Location: Left Arm)   Pulse (!) 57   Temp 98.3 F (36.8 C) (Oral)   Resp 18   SpO2 96%   Visual Acuity Right Eye Distance:   Left Eye Distance:   Bilateral Distance:    Right Eye Near:   Left Eye Near:    Bilateral Near:     Physical Exam Vitals and nursing note reviewed.  Constitutional:      Appearance: She is not ill-appearing or toxic-appearing.  HENT:     Head: Normocephalic and atraumatic.     Right Ear: Hearing and external ear normal.     Left Ear: Hearing and external ear normal.     Nose: Nose normal.     Mouth/Throat:     Lips: Pink.     Mouth: Mucous membranes are moist.  Eyes:     General: Lids are normal. Vision grossly intact. Gaze aligned appropriately.     Extraocular Movements: Extraocular movements intact.      Conjunctiva/sclera: Conjunctivae normal.  Pulmonary:     Effort: Pulmonary effort is normal.  Musculoskeletal:        General: Normal range of motion.     Right shoulder: Normal.     Left shoulder: Normal.     Right upper arm: Tenderness present.     Left upper arm: Normal.       Arms:     Cervical back: Neck supple.     Comments: RUE: no ecchymosis, deformity, edema, or swelling. 5/5 strength to bilateral upper extremities, sensation intact distally to BUE. +2 brachial pulses bilaterally.   Skin:    General: Skin is warm and dry.     Capillary Refill: Capillary refill takes less than 2 seconds.     Findings: No rash.  Neurological:     General: No focal deficit present.     Mental Status: She is alert and oriented to person, place, and time. Mental status is at baseline.     Cranial Nerves: No dysarthria or facial asymmetry.  Psychiatric:        Mood and Affect: Mood normal.        Speech: Speech normal.        Behavior: Behavior normal.        Thought Content: Thought content normal.        Judgment: Judgment normal.      UC Treatments / Results  Labs (all labs ordered are listed, but only abnormal results are displayed) Labs Reviewed - No data to display  EKG   Radiology No results found.  Procedures Procedures (including critical care time)  Medications Ordered in UC Medications - No data to display  Initial Impression / Assessment and Plan / UC Course  I have reviewed the triage vital signs and the nursing notes.  Pertinent labs & imaging results that were available during my care of the patient were reviewed by me and considered in my medical decision making (see chart for details).   1. Right arm pain Stable MSK exam, therefore deferred imaging. May use ibuprofen as needed for aches/pains. Neurovascularly intact distally to pain. May apply ice to area of greatest tenderness 20 minutes on 20 minutes off as needed.   Discussed physical exam and available  lab work findings in clinic with patient.  Counseled patient  regarding appropriate use of medications and potential side effects for all medications recommended or prescribed today. Discussed red flag signs and symptoms of worsening condition,when to call the PCP office, return to urgent care, and when to seek higher level of care in the emergency department. Patient verbalizes understanding and agreement with plan. All questions answered. Patient discharged in stable condition.    Final Clinical Impressions(s) / UC Diagnoses   Final diagnoses:  Right arm pain     Discharge Instructions      Take ibuprofen 600 mg (3 over-the-counter 200 mg pills) every 6 hours as needed for pain and inflammation to the right upper extremity as a result of the arm injury you sustained today.  Apply ice to the right arm and elevate to relieve swelling.  Your work note is at the end of your packet.  If you develop any new or worsening symptoms or do not improve in the next 2 to 3 days, please return.  If your symptoms are severe, please go to the emergency room.  Follow-up with your primary care provider for further evaluation and management of your symptoms as well as ongoing wellness visits.  I hope you feel better!     ED Prescriptions   None    PDMP not reviewed this encounter.   Talbot Grumbling, [place] 04/28/22 1012

## 2022-05-25 ENCOUNTER — Ambulatory Visit (HOSPITAL_COMMUNITY): Payer: Self-pay

## 2022-05-26 ENCOUNTER — Ambulatory Visit (INDEPENDENT_AMBULATORY_CARE_PROVIDER_SITE_OTHER): Payer: Medicaid Other

## 2022-05-26 ENCOUNTER — Encounter (HOSPITAL_COMMUNITY): Payer: Self-pay

## 2022-05-26 ENCOUNTER — Ambulatory Visit (HOSPITAL_COMMUNITY)
Admission: EM | Admit: 2022-05-26 | Discharge: 2022-05-26 | Disposition: A | Discharge status: Home / Self Care | Payer: Medicaid Other | Attending: Internal Medicine | Admitting: Internal Medicine

## 2022-05-26 DIAGNOSIS — M79674 Pain in right toe(s): Secondary | ICD-10-CM | POA: Diagnosis not present

## 2022-05-26 DIAGNOSIS — S90121A Contusion of right lesser toe(s) without damage to nail, initial encounter: Secondary | ICD-10-CM | POA: Diagnosis not present

## 2022-05-26 MED ORDER — IBUPROFEN 100 MG/5ML PO SUSP
400.0000 mg | Freq: Once | ORAL | Status: AC
  Administered 2022-05-26: 400 mg via ORAL

## 2022-05-26 MED ORDER — IBUPROFEN 800 MG PO TABS
ORAL_TABLET | ORAL | Status: AC
  Filled 2022-05-26: qty 1

## 2022-05-26 MED ORDER — IBUPROFEN 100 MG/5ML PO SUSP
ORAL | Status: AC
  Filled 2022-05-26: qty 20

## 2022-05-26 NOTE — Discharge Instructions (Addendum)
Your x-ray is negative for fracture, this is very reassuring.   Please use ibuprofen as needed.   Ice and elevate the great toe.   Schedule an appointment with podiatry for follow-up.   If you develop any new or worsening symptoms or do not improve in the next 2 to 3 days, please return.  If your symptoms are severe, please go to the emergency room.  Follow-up with your primary care provider for further evaluation and management of your symptoms as well as ongoing wellness visits.  I hope you feel better!

## 2022-05-26 NOTE — ED Triage Notes (Signed)
Pt reports right big toe pain after a pot drop on her foot.

## 2022-05-26 NOTE — ED Provider Notes (Signed)
[facility]    CSN: [id number] Arrival date & time: 05/26/22  1544      History   Chief Complaint Chief Complaint  Patient presents with   Toe Pain   Foot Pain    HPI Kristin Massey is a 37 y.o. female.   Patient presents to urgent care for evaluation of right great toe pain after she injured her right great toe on Monday, May 21, 2022 while she was at work.  She accidentally dropped a large bin used to keep drinks in at a restaurant onto her left great toe. She states the area hurt significantly after injury and became swollen. Yesterday, the swelling worsened significantly.  States it hurts to bear weight on her right foot due to great toe swelling and pain.  Denies previous injury to the right great toe, however reports previous injury to the metatarsals of the right foot.  No numbness or tingling distally to injury.  Walks with steady gait without limp. She took ibuprofen for pain after initial injury but has not taken any in the last 3-4 days.  She has been going to work and standing on her feet for long hours at a time despite swelling and pain after injury.      Past Medical History:  Diagnosis Date   Asthma     There are no problems to display for this patient.   History reviewed. No pertinent surgical history.  OB History   No obstetric history on file.      Home Medications    Prior to Admission medications   Medication Sig Start Date End Date Taking? Authorizing Provider  albuterol (PROVENTIL) (5 MG/ML) 0.5% nebulizer solution Take 0.5 mLs (2.5 mg total) by nebulization every 6 (six) hours as needed for wheezing or shortness of breath. 07/23/21   Smoot, Shawn Route, PA-C  albuterol (VENTOLIN HFA) 108 (90 Base) MCG/ACT inhaler Inhale 1-2 puffs into the lungs every 6 (six) hours as needed for wheezing or shortness of breath. 07/23/21   Smoot, Shawn Route, PA-C  levonorgestrel (MIRENA) 20 MCG/24HR IUD 1 each by Intrauterine route once.    [provider]  fluticasone (FLONASE) 50 MCG/ACT nasal spray Place 2 sprays into both nostrils daily. FOR NASAL CONGESTION Patient not taking: Reported on 10/11/2018 04/13/16 04/25/20  Liberty Handy, PA-C  loratadine (CLARITIN) 10 MG tablet Take 1 tablet (10 mg total) by mouth daily. Patient not taking: Reported on 10/11/2018 04/13/16 04/25/20  Liberty Handy, PA-C    Family History Family History  Problem Relation Age of Onset   Cancer Mother    Diabetes Other    Hypertension Other     Social History Social History   Tobacco Use   Smoking status: Never   Smokeless tobacco: Never  Vaping Use   Vaping Use: Never used  Substance Use Topics   Alcohol use: No   Drug use: Yes    Types: Marijuana     Allergies   Patient has no known allergies.   Review of Systems Review of Systems Per HPI  Physical Exam Triage Vital Signs ED Triage Vitals  Enc Vitals Group     BP 05/26/22 1629 (!) 100/56     Pulse Rate 05/26/22 1629 60     Resp 05/26/22 1629 18     Temp 05/26/22 1629 98.8 F (37.1 C)     Temp Source 05/26/22 1629 Oral     SpO2 05/26/22 1629 98 %  Weight --      Height --      Head Circumference --      Peak Flow --      Pain Score 05/26/22 1632 7     Pain Loc --      Pain Edu? --      Excl. in GC? --    No data found.  Updated Vital Signs BP (!) 100/56 (BP Location: Left Arm)   Pulse 60   Temp 98.8 F (37.1 C) (Oral)   Resp 18   SpO2 98%   Visual Acuity Right Eye Distance:   Left Eye Distance:   Bilateral Distance:    Right Eye Near:   Left Eye Near:    Bilateral Near:     Physical Exam Vitals and nursing note reviewed.  Constitutional:      Appearance: She is not ill-appearing or toxic-appearing.  HENT:     Head: Normocephalic and atraumatic.     Right Ear: Hearing and external ear normal.     Left Ear: Hearing and external ear normal.     Nose: Nose normal.     Mouth/Throat:     Lips: Pink.  Eyes:     General: Lids are normal.  Vision grossly intact. Gaze aligned appropriately.     Extraocular Movements: Extraocular movements intact.     Conjunctiva/sclera: Conjunctivae normal.  Pulmonary:     Effort: Pulmonary effort is normal.  Musculoskeletal:     Cervical back: Neck supple.  Feet:     Comments: Significant swelling, bruising, and hematoma present to the distal aspect of the right great toe near the DIP joint.  Area of swelling at the proximal nail fold is fluctuant but is not warm, erythematous, or tender to palpation.  She has a difficult time performing range of motion actively to the right great toe due to swelling and tenderness.  Capillary refill to the right great toe is less than 2, sensation intact distally to injury.  +2 dorsalis pedis pulses bilaterally.  See images below for further detail. Skin:    General: Skin is warm and dry.     Capillary Refill: Capillary refill takes less than 2 seconds.     Findings: No rash.  Neurological:     General: No focal deficit present.     Mental Status: She is alert and oriented to person, place, and time. Mental status is at baseline.     Cranial Nerves: No dysarthria or facial asymmetry.  Psychiatric:        Mood and Affect: Mood normal.        Speech: Speech normal.        Behavior: Behavior normal.        Thought Content: Thought content normal.        Judgment: Judgment normal.         UC Treatments / Results  Labs (all labs ordered are listed, but only abnormal results are displayed) Labs Reviewed - No data to display  EKG   Radiology DG Foot Complete Right  Result Date: 05/26/2022 CLINICAL DATA:  injury due to dropping gardening pot on her foot EXAM: RIGHT FOOT COMPLETE - 3+ VIEW COMPARISON:  X-ray right foot 07/23/2020 FINDINGS: There is no evidence of fracture or dislocation. There is no evidence of arthropathy or other focal bone abnormality. Mild subcutaneus soft tissue edema of the dorsum of the foot. IMPRESSION: No acute displaced  fracture or dislocation. Electronically Signed   By: Tish Frederickson  M.D.   On: 05/26/2022 17:39    Procedures Procedures (including critical care time)  Medications Ordered in UC Medications  ibuprofen (ADVIL) 100 MG/5ML suspension 400 mg (400 mg Oral Given 05/26/22 1814)    Initial Impression / Assessment and Plan / UC Course  I have reviewed the triage vital signs and the nursing notes.  Pertinent labs & imaging results that were available during my care of the patient were reviewed by me and considered in my medical decision making (see chart for details).   1.  Great toe pain, hematoma of toe of right foot X-ray is negative for acute fracture/bony abnormality to the right great toe but does show significant soft tissue swelling to the dorsal aspect of the right foot.  No signs of infection, swelling consistent with hematoma related to injury.  RICE advised.  Offered ibuprofen in clinic, patient requesting liquid form of ibuprofen.  400 mg ibuprofen given for pain and inflammation.  May repeat this at home as needed every 4-6 hours.  Neurovascularly intact distally to injury.Follow-up information for podiatry given, encouraged patient to call podiatry office on Monday to schedule an appointment for follow-up.  Work excuse note given.  Advised to rest and stay off of her foot to reduce swelling.    She is agreeable with plan.  Discussed physical exam and available lab work findings in clinic with patient.  Counseled patient regarding appropriate use of medications and potential side effects for all medications recommended or prescribed today. Discussed red flag signs and symptoms of worsening condition,when to call the PCP office, return to urgent care, and when to seek higher level of care in the emergency department. Patient verbalizes understanding and agreement with plan. All questions answered. Patient discharged in stable condition.    Final Clinical Impressions(s) / UC Diagnoses    Final diagnoses:  Great toe pain, right  Hematoma of toe of right foot, initial encounter     Discharge Instructions      Your x-ray is negative for fracture, this is very reassuring.   Please use ibuprofen as needed.   Ice and elevate the great toe.   Schedule an appointment with podiatry for follow-up.   If you develop any new or worsening symptoms or do not improve in the next 2 to 3 days, please return.  If your symptoms are severe, please go to the emergency room.  Follow-up with your primary care provider for further evaluation and management of your symptoms as well as ongoing wellness visits.  I hope you feel better!   ED Prescriptions   None    PDMP not reviewed this encounter.   Carlisle Beers, [place] 05/26/22 2036

## 2022-06-19 ENCOUNTER — Ambulatory Visit: Payer: Medicaid Other | Admitting: Podiatry

## 2022-06-28 ENCOUNTER — Ambulatory Visit: Payer: Medicaid Other | Admitting: Podiatry

## 2022-07-05 ENCOUNTER — Ambulatory Visit: Payer: Medicaid Other | Admitting: Podiatry
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
## 2022-09-04 ENCOUNTER — Ambulatory Visit (HOSPITAL_COMMUNITY)
Admission: EM | Admit: 2022-09-04 | Discharge: 2022-09-04 | Disposition: A | Discharge status: Home / Self Care | Payer: Medicaid Other | Attending: Physician Assistant | Admitting: Physician Assistant

## 2022-09-04 ENCOUNTER — Other Ambulatory Visit: Payer: Self-pay

## 2022-09-04 ENCOUNTER — Ambulatory Visit (INDEPENDENT_AMBULATORY_CARE_PROVIDER_SITE_OTHER): Payer: Medicaid Other

## 2022-09-04 ENCOUNTER — Encounter (HOSPITAL_COMMUNITY): Payer: Self-pay | Admitting: *Deleted

## 2022-09-04 DIAGNOSIS — S63634A Sprain of interphalangeal joint of right ring finger, initial encounter: Secondary | ICD-10-CM | POA: Diagnosis not present

## 2022-09-04 DIAGNOSIS — S01511A Laceration without foreign body of lip, initial encounter: Secondary | ICD-10-CM | POA: Diagnosis not present

## 2022-09-04 DIAGNOSIS — M79641 Pain in right hand: Secondary | ICD-10-CM

## 2022-09-04 DIAGNOSIS — S0081XA Abrasion of other part of head, initial encounter: Secondary | ICD-10-CM

## 2022-09-04 DIAGNOSIS — M79644 Pain in right finger(s): Secondary | ICD-10-CM

## 2022-09-04 MED ORDER — MUPIROCIN 2 % EX OINT: 1.0000 | TOPICAL_OINTMENT | Freq: Every day | CUTANEOUS | 0 refills | Status: AC

## 2022-09-04 MED ORDER — IBUPROFEN 100 MG/5ML PO SUSP: 400.0000 mg | Freq: Four times a day (QID) | ORAL | 0 refills | Status: AC | PRN

## 2022-09-04 MED ORDER — IBUPROFEN 100 MG/5ML PO SUSP
ORAL | Status: AC
  Filled 2022-09-04: qty 20

## 2022-09-04 MED ORDER — IBUPROFEN 100 MG/5ML PO SUSP
400.0000 mg | Freq: Once | ORAL | Status: AC
Start: 2022-09-04 — End: 2022-09-04
  Administered 2022-09-04: 400 mg via ORAL

## 2022-09-04 NOTE — Discharge Instructions (Signed)
Your x-ray was normal with no evidence of a fracture or dislocation.  I suspect you have injured some of the soft tissue in your finger.  Use a splint for comfort and support.  Take ibuprofen for pain relief.  Keep it elevated and use ice.  Apply ice to your lip to help with swelling.  This should heal on its own within a few days.  If it becomes more swollen or painful please return for reevaluation.  Apply Bactroban ointment to the wound on your forehead.  If there is any signs of infections including redness, swelling, drainage you need to be seen again.  If anything changes or worsens please return for reevaluation.
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
## 2023-05-01 ENCOUNTER — Other Ambulatory Visit: Payer: Self-pay

## 2023-05-01 ENCOUNTER — Emergency Department (HOSPITAL_COMMUNITY)

## 2023-05-01 ENCOUNTER — Emergency Department (HOSPITAL_COMMUNITY)
Admission: EM | Admit: 2023-05-01 | Discharge: 2023-05-02 | Disposition: A | Discharge status: Home / Self Care | Attending: Emergency Medicine | Admitting: Emergency Medicine

## 2023-05-01 ENCOUNTER — Encounter (HOSPITAL_COMMUNITY): Payer: Self-pay | Admitting: Emergency Medicine

## 2023-05-01 DIAGNOSIS — R0981 Nasal congestion: Secondary | ICD-10-CM | POA: Diagnosis not present

## 2023-05-01 DIAGNOSIS — R6883 Chills (without fever): Secondary | ICD-10-CM | POA: Insufficient documentation

## 2023-05-01 DIAGNOSIS — J029 Acute pharyngitis, unspecified: Secondary | ICD-10-CM | POA: Insufficient documentation

## 2023-05-01 DIAGNOSIS — R059 Cough, unspecified: Secondary | ICD-10-CM | POA: Diagnosis present

## 2023-05-01 DIAGNOSIS — R519 Headache, unspecified: Secondary | ICD-10-CM | POA: Diagnosis not present

## 2023-05-01 DIAGNOSIS — R5383 Other fatigue: Secondary | ICD-10-CM | POA: Insufficient documentation

## 2023-05-01 DIAGNOSIS — J069 Acute upper respiratory infection, unspecified: Secondary | ICD-10-CM

## 2023-05-01 LAB — RESP PANEL BY RT-PCR (RSV, FLU A&B, COVID)  RVPGX2
Influenza A by PCR: NEGATIVE
Influenza B by PCR: NEGATIVE
Resp Syncytial Virus by PCR: NEGATIVE
SARS Coronavirus 2 by RT PCR: NEGATIVE

## 2023-05-01 MED ORDER — FLUTICASONE PROPIONATE 50 MCG/ACT NA SUSP: 1.0000 | Freq: Every day | NASAL | 0 refills | Status: AC

## 2023-05-01 MED ORDER — DOXYCYCLINE HYCLATE 100 MG PO CAPS: 100.0000 mg | ORAL_CAPSULE | Freq: Two times a day (BID) | ORAL | 0 refills | Status: AC

## 2023-05-01 MED ORDER — DOXYCYCLINE HYCLATE 100 MG PO TABS
100.0000 mg | ORAL_TABLET | Freq: Once | ORAL | Status: AC
  Administered 2023-05-02: 100 mg via ORAL
  Filled 2023-05-01: qty 1

## 2023-05-01 MED ORDER — KETOROLAC TROMETHAMINE 30 MG/ML IJ SOLN
30.0000 mg | Freq: Once | INTRAMUSCULAR | Status: AC
  Administered 2023-05-01: 30 mg via INTRAMUSCULAR
  Filled 2023-05-01: qty 1

## 2023-05-01 NOTE — ED Triage Notes (Signed)
 Patient c/o sinus pressure, eye redness and swelling, chills, scratchy throat and cough x 1-2 days.

## 2023-05-01 NOTE — ED Provider Notes (Signed)
 [facility] Provider Note   CSN: [id number] Arrival date & time: 05/01/23  2133     History  Chief Complaint  Patient presents with   Sinus Problem    Kristin Massey is a 38 y.o. female.  Patient with history of asthma here with 2 days of sinus pressure, congestion, scratchy throat, dry cough, chills.  No fever.  No chest pain or shortness of breath.  Has had clear nasal drainage, congestion, gradual onset headache behind her eyes similar to previous migraines.  Cough is nonproductive.  Denies shortness of breath or chest pain.  No abdominal pain, vomiting or diarrhea.  No travel or sick contacts.  No fever at home.  Complains of nasal congestion, sinus pressure, coughing and scratchy throat.  Did not take any medications at home. No thunderclap onset headache.  The history is provided by the patient.  Sinus Problem Associated symptoms include headaches. Pertinent negatives include no abdominal pain and no shortness of breath.       Home Medications Prior to Admission medications   Medication Sig Start Date End Date Taking? Authorizing Provider  albuterol (PROVENTIL) (5 MG/ML) 0.5% nebulizer solution Take 0.5 mLs (2.5 mg total) by nebulization every 6 (six) hours as needed for wheezing or shortness of breath. 07/23/21   Smoot, Shawn Route, PA-C  albuterol (VENTOLIN HFA) 108 (90 Base) MCG/ACT inhaler Inhale 1-2 puffs into the lungs every 6 (six) hours as needed for wheezing or shortness of breath. 07/23/21   Smoot, Sarah A, PA-C  ibuprofen (ADVIL) 100 MG/5ML suspension Take 20 mLs (400 mg total) by mouth every 6 (six) hours as needed. 09/04/22   Raspet, Noberto Retort, PA-C  levonorgestrel (MIRENA) 20 MCG/24HR IUD 1 each by Intrauterine route once.    [provider]  mupirocin ointment (BACTROBAN) 2 % Apply 1 Application topically daily. 09/04/22   Raspet, Erin K, PA-C  fluticasone (FLONASE) 50 MCG/ACT nasal spray Place 2 sprays into both nostrils  daily. FOR NASAL CONGESTION Patient not taking: Reported on 10/11/2018 04/13/16 04/25/20  Liberty Handy, PA-C  loratadine (CLARITIN) 10 MG tablet Take 1 tablet (10 mg total) by mouth daily. Patient not taking: Reported on 10/11/2018 04/13/16 04/25/20  Liberty Handy, PA-C      Allergies    Patient has no known allergies.    Review of Systems   Review of Systems  Constitutional:  Positive for fatigue. Negative for activity change, appetite change and fever.  HENT:  Positive for congestion, rhinorrhea, sinus pressure and sore throat.   Respiratory:  Positive for cough. Negative for chest tightness and shortness of breath.   Gastrointestinal:  Negative for abdominal pain, nausea and vomiting.  Genitourinary:  Negative for dysuria and hematuria.  Musculoskeletal:  Negative for arthralgias and myalgias.  Skin:  Negative for rash.  Neurological:  Positive for headaches.   all other systems are negative except as noted in the HPI and PMH.    Physical Exam Updated Vital Signs BP 122/86   Pulse 84   Temp 99.1 F (37.3 C)   Resp 18   Wt 113.4 kg   SpO2 100%   BMI 45.73 kg/m  Physical Exam Vitals and nursing note reviewed.  Constitutional:      General: She is not in acute distress.    Appearance: She is well-developed. She is not ill-appearing.  HENT:     Head: Normocephalic and atraumatic.     Right Ear: Tympanic membrane normal.  Left Ear: Tympanic membrane normal.     Nose: Congestion present.     Mouth/Throat:     Pharynx: No oropharyngeal exudate.     Comments: Frontal sinus tenderness. OP clear.  Eyes:     Conjunctiva/sclera: Conjunctivae normal.     Pupils: Pupils are equal, round, and reactive to light.  Neck:     Comments: No meningismus. Cardiovascular:     Rate and Rhythm: Normal rate and regular rhythm.     Heart sounds: Normal heart sounds. No murmur heard. Pulmonary:     Effort: Pulmonary effort is normal. No respiratory distress.     Breath sounds:  Normal breath sounds.  Abdominal:     Palpations: Abdomen is soft.     Tenderness: There is no abdominal tenderness. There is no guarding or rebound.  Musculoskeletal:        General: No tenderness. Normal range of motion.     Cervical back: Normal range of motion and neck supple.  Skin:    General: Skin is warm.  Neurological:     Mental Status: She is alert and oriented to person, place, and time.     Cranial Nerves: No cranial nerve deficit.     Motor: No abnormal muscle tone.     Coordination: Coordination normal.     Comments:  5/5 strength throughout. CN 2-12 intact.Equal grip strength.   Psychiatric:        Behavior: Behavior normal.     ED Results / Procedures / Treatments   Labs (all labs ordered are listed, but only abnormal results are displayed) Labs Reviewed  RESP PANEL BY RT-PCR (RSV, FLU A&B, COVID)  [id number]    EKG None  Radiology DG Chest 2 View Result Date: 05/01/2023 CLINICAL DATA:  Cough, nasal congestion for 2 days EXAM: CHEST - 2 VIEW COMPARISON:  02/03/2018 FINDINGS: The heart size and mediastinal contours are within normal limits. Both lungs are clear. The visualized skeletal structures are unremarkable. IMPRESSION: No active cardiopulmonary disease. Electronically Signed   By: Sharlet Salina M.D.   On: 05/01/2023 23:25    Procedures Procedures    Medications Ordered in ED Medications  ketorolac (TORADOL) 30 MG/ML injection 30 mg (has no administration in time range)    ED Course/ Medical Decision Making/ A&P                                 Medical Decision Making Amount and/or Complexity of Data Reviewed Labs: ordered. Decision-making details documented in ED Course. Radiology: ordered and independent interpretation performed. Decision-making details documented in ED Course. ECG/medicine tests: ordered and independent interpretation performed. Decision-making details documented in ED Course.  Risk Prescription drug management.   2 days  of sinus pressure, congestion, scratchy throat, cough and chills.  Stable vitals.  No distress.  Lungs are clear.  No hypoxia or increased work of breathing.  No meningismus.  Gradual onset headache similar to previous migraines.  No thunderclap onset.  Low suspicion for  meningitis, temporal arteritis, subarachnoid hemorrhage.  COVID and flu swabs are negative in triage.  Chest x-ray negative for infiltrate.  Results reviewed and interpreted by me.  Patient appears well.  No fever or meningismus.  Gradual onset headache without thunderclap onset.  Will treat supportively for suspected sinusitis with antibiotics and nasal steroids.  Take antipyretics as needed.  Return precautions discussed.        Final Clinical Impression(s) / ED  Diagnoses Final diagnoses:  None    Rx / DC Orders ED Discharge Orders     None         Nazariah Cadet, Jeannett Senior, MD 05/01/23 2352

## 2023-05-01 NOTE — Discharge Instructions (Signed)
 Take antibiotics for possible sinus infection.  Use nasal steroids as prescribed to stop using after 3 days.  Use Tylenol or Motrin as needed for any fever.  Follow-up with your doctor.  Return to the ED with new or worsening symptoms.
